# Patient Record
Sex: Female | Born: 1955 | Race: White | Hispanic: No | Marital: Married | Smoking: Never smoker
Health system: Southern US, Community
[De-identification: ages and names within clinical notes are randomized; demographics above are authoritative.]

## PROBLEM LIST (undated history)

## (undated) DIAGNOSIS — E079 Disorder of thyroid, unspecified: Secondary | ICD-10-CM

## (undated) DIAGNOSIS — C4491 Basal cell carcinoma of skin, unspecified: Secondary | ICD-10-CM

## (undated) DIAGNOSIS — D860 Sarcoidosis of lung: Secondary | ICD-10-CM

## (undated) DIAGNOSIS — E785 Hyperlipidemia, unspecified: Secondary | ICD-10-CM

## (undated) DIAGNOSIS — E119 Type 2 diabetes mellitus without complications: Secondary | ICD-10-CM

## (undated) DIAGNOSIS — G4730 Sleep apnea, unspecified: Secondary | ICD-10-CM

## (undated) DIAGNOSIS — D849 Immunodeficiency, unspecified: Secondary | ICD-10-CM

## (undated) HISTORY — PX: CHOLECYSTECTOMY: SHX55

## (undated) HISTORY — PX: OTHER SURGICAL HISTORY: SHX169

## (undated) HISTORY — PX: ABLATION: SHX5711

## (undated) HISTORY — PX: LUNG TRANSPLANT: SHX234

---

## 2021-05-14 ENCOUNTER — Inpatient Hospital Stay (HOSPITAL_COMMUNITY): Payer: BC Managed Care – PPO

## 2021-05-14 ENCOUNTER — Emergency Department (HOSPITAL_COMMUNITY): Payer: BC Managed Care – PPO

## 2021-05-14 ENCOUNTER — Inpatient Hospital Stay (HOSPITAL_COMMUNITY)
Admission: EM | Admit: 2021-05-14 | Discharge: 2021-05-15 | DRG: 309 | Disposition: A | Discharge status: Home / Self Care | Payer: BC Managed Care – PPO | Attending: Internal Medicine | Admitting: Internal Medicine

## 2021-05-14 ENCOUNTER — Other Ambulatory Visit: Payer: Self-pay

## 2021-05-14 ENCOUNTER — Encounter (HOSPITAL_COMMUNITY): Payer: Self-pay | Admitting: *Deleted

## 2021-05-14 DIAGNOSIS — Z7984 Long term (current) use of oral hypoglycemic drugs: Secondary | ICD-10-CM | POA: Diagnosis not present

## 2021-05-14 DIAGNOSIS — I9589 Other hypotension: Secondary | ICD-10-CM | POA: Diagnosis present

## 2021-05-14 DIAGNOSIS — Z8249 Family history of ischemic heart disease and other diseases of the circulatory system: Secondary | ICD-10-CM | POA: Diagnosis not present

## 2021-05-14 DIAGNOSIS — B349 Viral infection, unspecified: Secondary | ICD-10-CM | POA: Diagnosis present

## 2021-05-14 DIAGNOSIS — I4891 Unspecified atrial fibrillation: Secondary | ICD-10-CM

## 2021-05-14 DIAGNOSIS — E039 Hypothyroidism, unspecified: Secondary | ICD-10-CM | POA: Diagnosis present

## 2021-05-14 DIAGNOSIS — E119 Type 2 diabetes mellitus without complications: Secondary | ICD-10-CM | POA: Diagnosis present

## 2021-05-14 DIAGNOSIS — Z7952 Long term (current) use of systemic steroids: Secondary | ICD-10-CM

## 2021-05-14 DIAGNOSIS — Z20822 Contact with and (suspected) exposure to covid-19: Secondary | ICD-10-CM | POA: Diagnosis present

## 2021-05-14 DIAGNOSIS — Z79890 Hormone replacement therapy (postmenopausal): Secondary | ICD-10-CM | POA: Diagnosis not present

## 2021-05-14 DIAGNOSIS — Z85828 Personal history of other malignant neoplasm of skin: Secondary | ICD-10-CM

## 2021-05-14 DIAGNOSIS — Z8673 Personal history of transient ischemic attack (TIA), and cerebral infarction without residual deficits: Secondary | ICD-10-CM

## 2021-05-14 DIAGNOSIS — I471 Supraventricular tachycardia: Principal | ICD-10-CM | POA: Diagnosis present

## 2021-05-14 DIAGNOSIS — Z7951 Long term (current) use of inhaled steroids: Secondary | ICD-10-CM

## 2021-05-14 DIAGNOSIS — R000 Tachycardia, unspecified: Secondary | ICD-10-CM | POA: Diagnosis not present

## 2021-05-14 DIAGNOSIS — E872 Acidosis, unspecified: Secondary | ICD-10-CM | POA: Diagnosis present

## 2021-05-14 DIAGNOSIS — E785 Hyperlipidemia, unspecified: Secondary | ICD-10-CM | POA: Diagnosis present

## 2021-05-14 DIAGNOSIS — D709 Neutropenia, unspecified: Secondary | ICD-10-CM | POA: Diagnosis present

## 2021-05-14 DIAGNOSIS — Z95828 Presence of other vascular implants and grafts: Secondary | ICD-10-CM

## 2021-05-14 DIAGNOSIS — Z86718 Personal history of other venous thrombosis and embolism: Secondary | ICD-10-CM | POA: Diagnosis not present

## 2021-05-14 DIAGNOSIS — R6510 Systemic inflammatory response syndrome (SIRS) of non-infectious origin without acute organ dysfunction: Secondary | ICD-10-CM | POA: Diagnosis present

## 2021-05-14 DIAGNOSIS — D860 Sarcoidosis of lung: Secondary | ICD-10-CM | POA: Diagnosis present

## 2021-05-14 DIAGNOSIS — Z942 Lung transplant status: Secondary | ICD-10-CM

## 2021-05-14 HISTORY — DX: Type 2 diabetes mellitus without complications: E11.9

## 2021-05-14 HISTORY — DX: Hyperlipidemia, unspecified: E78.5

## 2021-05-14 HISTORY — DX: Immunodeficiency, unspecified: D84.9

## 2021-05-14 HISTORY — DX: Disorder of thyroid, unspecified: E07.9

## 2021-05-14 HISTORY — DX: Sarcoidosis of lung: D86.0

## 2021-05-14 HISTORY — DX: Sleep apnea, unspecified: G47.30

## 2021-05-14 HISTORY — DX: Basal cell carcinoma of skin, unspecified: C44.91

## 2021-05-14 LAB — ECHOCARDIOGRAM COMPLETE
AR max vel: 1.94 cm2
AV Area VTI: 2.27 cm2
AV Area mean vel: 2.05 cm2
AV Mean grad: 3 mmHg
AV Peak grad: 5.4 mmHg
Ao pk vel: 1.16 m/s
Area-P 1/2: 3.77 cm2
Calc EF: 66.3 %
Height: 64 in
MV VTI: 0.41 cm2
S' Lateral: 1.6 cm
Single Plane A2C EF: 69 %
Single Plane A4C EF: 61 %
Weight: 2560 oz

## 2021-05-14 LAB — RESPIRATORY PANEL BY PCR

## 2021-05-14 LAB — COMPREHENSIVE METABOLIC PANEL
ALT: 19 U/L (ref 0–44)
AST: 29 U/L (ref 15–41)
Albumin: 3.9 g/dL (ref 3.5–5.0)
Alkaline Phosphatase: 58 U/L (ref 38–126)
Anion gap: 9 (ref 5–15)
BUN: 23 mg/dL (ref 8–23)
CO2: 24 mmol/L (ref 22–32)
Calcium: 9 mg/dL (ref 8.9–10.3)
Chloride: 103 mmol/L (ref 98–111)
Creatinine, Ser: 1.28 mg/dL — ABNORMAL HIGH (ref 0.44–1.00)
GFR, Estimated: 47 mL/min — ABNORMAL LOW (ref >60)
Glucose, Bld: 207 mg/dL — ABNORMAL HIGH (ref 70–99)
Potassium: 3.7 mmol/L (ref 3.5–5.1)
Sodium: 136 mmol/L (ref 135–145)
Total Bilirubin: 0.7 mg/dL (ref 0.3–1.2)
Total Protein: 6.7 g/dL (ref 6.5–8.1)

## 2021-05-14 LAB — MRSA NEXT GEN BY PCR, NASAL: MRSA by PCR Next Gen: NOT DETECTED

## 2021-05-14 LAB — DIFFERENTIAL
Abs Immature Granulocytes: 0.37 10*3/uL — ABNORMAL HIGH (ref 0.00–0.07)
Basophils Absolute: 0 10*3/uL (ref 0.0–0.1)
Basophils Relative: 0 %
Eosinophils Absolute: 0.1 10*3/uL (ref 0.0–0.5)
Eosinophils Relative: 1 %
Immature Granulocytes: 10 %
Lymphocytes Relative: 29 %
Lymphs Abs: 1.1 10*3/uL (ref 0.7–4.0)
Monocytes Absolute: 0.5 10*3/uL (ref 0.1–1.0)
Monocytes Relative: 14 %
Neutro Abs: 1.6 10*3/uL — ABNORMAL LOW (ref 1.7–7.7)
Neutrophils Relative %: 46 %

## 2021-05-14 LAB — CBC
HCT: 34.4 % — ABNORMAL LOW (ref 36.0–46.0)
Hemoglobin: 11.2 g/dL — ABNORMAL LOW (ref 12.0–15.0)
MCH: 33.4 pg (ref 26.0–34.0)
MCHC: 32.6 g/dL (ref 30.0–36.0)
MCV: 102.7 fL — ABNORMAL HIGH (ref 80.0–100.0)
Platelets: 151 10*3/uL (ref 150–400)
RBC: 3.35 MIL/uL — ABNORMAL LOW (ref 3.87–5.11)
RDW: 15 % (ref 11.5–15.5)
WBC: 3.5 10*3/uL — ABNORMAL LOW (ref 4.0–10.5)
nRBC: 0 % (ref 0.0–0.2)

## 2021-05-14 LAB — TSH: TSH: 2.813 u[IU]/mL (ref 0.350–4.500)

## 2021-05-14 LAB — URINALYSIS, ROUTINE W REFLEX MICROSCOPIC
Bilirubin Urine: NEGATIVE
Glucose, UA: NEGATIVE mg/dL
Hgb urine dipstick: NEGATIVE
Ketones, ur: NEGATIVE mg/dL
Leukocytes,Ua: NEGATIVE
Nitrite: NEGATIVE
Protein, ur: NEGATIVE mg/dL
Specific Gravity, Urine: 1.011 (ref 1.005–1.030)
pH: 7 (ref 5.0–8.0)

## 2021-05-14 LAB — I-STAT CHEM 8, ED
BUN: 20 mg/dL (ref 8–23)
Calcium, Ion: 1.12 mmol/L — ABNORMAL LOW (ref 1.15–1.40)
Chloride: 104 mmol/L (ref 98–111)
Creatinine, Ser: 1.3 mg/dL — ABNORMAL HIGH (ref 0.44–1.00)
Glucose, Bld: 201 mg/dL — ABNORMAL HIGH (ref 70–99)
HCT: 32 % — ABNORMAL LOW (ref 36.0–46.0)
Hemoglobin: 10.9 g/dL — ABNORMAL LOW (ref 12.0–15.0)
Potassium: 3.9 mmol/L (ref 3.5–5.1)
Sodium: 140 mmol/L (ref 135–145)
TCO2: 26 mmol/L (ref 22–32)

## 2021-05-14 LAB — RESP PANEL BY RT-PCR (FLU A&B, COVID) ARPGX2
Influenza A by PCR: NEGATIVE
Influenza B by PCR: NEGATIVE
SARS Coronavirus 2 by RT PCR: NEGATIVE

## 2021-05-14 LAB — HIV ANTIBODY (ROUTINE TESTING W REFLEX): HIV Screen 4th Generation wRfx: NONREACTIVE

## 2021-05-14 LAB — APTT: aPTT: 27 seconds (ref 24–36)

## 2021-05-14 LAB — PROTIME-INR
INR: 1 (ref 0.8–1.2)
Prothrombin Time: 12.8 seconds (ref 11.4–15.2)

## 2021-05-14 LAB — LACTIC ACID, PLASMA
Lactic Acid, Venous: 2.2 mmol/L (ref 0.5–1.9)
Lactic Acid, Venous: 2.5 mmol/L (ref 0.5–1.9)

## 2021-05-14 LAB — MAGNESIUM: Magnesium: 1.9 mg/dL (ref 1.7–2.4)

## 2021-05-14 LAB — BRAIN NATRIURETIC PEPTIDE: B Natriuretic Peptide: 261.7 pg/mL — ABNORMAL HIGH (ref 0.0–100.0)

## 2021-05-14 MED ORDER — LACTATED RINGERS IV BOLUS
500.0000 mL | Freq: Once | INTRAVENOUS | Status: AC
  Administered 2021-05-14: 500 mL via INTRAVENOUS

## 2021-05-14 MED ORDER — VANCOMYCIN HCL IN DEXTROSE 1-5 GM/200ML-% IV SOLN
1000.0000 mg | INTRAVENOUS | Status: DC
  Administered 2021-05-15: 1000 mg via INTRAVENOUS
  Filled 2021-05-14: qty 200

## 2021-05-14 MED ORDER — LACTATED RINGERS IV SOLN: INTRAVENOUS | Status: AC

## 2021-05-14 MED ORDER — VANCOMYCIN HCL IN DEXTROSE 1-5 GM/200ML-% IV SOLN: 1000.0000 mg | Freq: Once | INTRAVENOUS | Status: DC

## 2021-05-14 MED ORDER — LETERMOVIR 240 MG PO TABS: 480.0000 mg | ORAL_TABLET | Freq: Every day | ORAL | Status: DC

## 2021-05-14 MED ORDER — METRONIDAZOLE 500 MG/100ML IV SOLN
500.0000 mg | Freq: Once | INTRAVENOUS | Status: AC
  Administered 2021-05-14: 500 mg via INTRAVENOUS
  Filled 2021-05-14: qty 100

## 2021-05-14 MED ORDER — SODIUM CHLORIDE 0.9 % IV SOLN
2.0000 g | Freq: Two times a day (BID) | INTRAVENOUS | Status: DC
  Administered 2021-05-14 – 2021-05-15 (×2): 2 g via INTRAVENOUS
  Filled 2021-05-14 (×2): qty 2

## 2021-05-14 MED ORDER — ALBUTEROL SULFATE (2.5 MG/3ML) 0.083% IN NEBU
2.5000 mg | INHALATION_SOLUTION | Freq: Four times a day (QID) | RESPIRATORY_TRACT | Status: DC | PRN
Start: 2021-05-14 — End: 2021-05-15

## 2021-05-14 MED ORDER — PANTOPRAZOLE SODIUM 40 MG PO TBEC
40.0000 mg | DELAYED_RELEASE_TABLET | Freq: Every day | ORAL | Status: DC
  Administered 2021-05-14 – 2021-05-15 (×2): 40 mg via ORAL
  Filled 2021-05-14 (×2): qty 1

## 2021-05-14 MED ORDER — FLUTICASONE FUROATE-VILANTEROL 200-25 MCG/ACT IN AEPB
1.0000 | INHALATION_SPRAY | Freq: Every day | RESPIRATORY_TRACT | Status: DC
  Filled 2021-05-14: qty 28

## 2021-05-14 MED ORDER — LEVOTHYROXINE SODIUM 50 MCG PO TABS
50.0000 ug | ORAL_TABLET | Freq: Every day | ORAL | Status: DC
  Administered 2021-05-15: 50 ug via ORAL
  Filled 2021-05-14: qty 1

## 2021-05-14 MED ORDER — ATOVAQUONE 750 MG/5ML PO SUSP
1500.0000 mg | Freq: Every morning | ORAL | Status: DC
  Administered 2021-05-14 – 2021-05-15 (×2): 1500 mg via ORAL
  Filled 2021-05-14 (×2): qty 10

## 2021-05-14 MED ORDER — DILTIAZEM LOAD VIA INFUSION
10.0000 mg | Freq: Once | INTRAVENOUS | Status: AC
  Administered 2021-05-14: 10 mg via INTRAVENOUS
  Filled 2021-05-14: qty 10

## 2021-05-14 MED ORDER — SODIUM CHLORIDE 0.9 % IV SOLN
2.0000 g | Freq: Once | INTRAVENOUS | Status: AC
  Administered 2021-05-14: 2 g via INTRAVENOUS
  Filled 2021-05-14: qty 2

## 2021-05-14 MED ORDER — LACTATED RINGERS IV SOLN: INTRAVENOUS | Status: DC

## 2021-05-14 MED ORDER — ONDANSETRON HCL 4 MG/2ML IJ SOLN
4.0000 mg | Freq: Four times a day (QID) | INTRAMUSCULAR | Status: DC | PRN
Start: 2021-05-14 — End: 2021-05-15

## 2021-05-14 MED ORDER — MYCOPHENOLATE MOFETIL 250 MG PO CAPS: 250.0000 mg | ORAL_CAPSULE | Freq: Two times a day (BID) | ORAL | Status: DC

## 2021-05-14 MED ORDER — MAGNESIUM OXIDE -MG SUPPLEMENT 400 (240 MG) MG PO TABS: 200.0000 mg | ORAL_TABLET | Freq: Every day | ORAL | Status: DC

## 2021-05-14 MED ORDER — HEPARIN BOLUS VIA INFUSION
2000.0000 [IU] | Freq: Once | INTRAVENOUS | Status: AC
  Administered 2021-05-14: 2000 [IU] via INTRAVENOUS
  Filled 2021-05-14: qty 2000

## 2021-05-14 MED ORDER — LACTATED RINGERS IV BOLUS
500.0000 mL | Freq: Once | INTRAVENOUS | Status: DC
Start: 2021-05-14 — End: 2021-05-14

## 2021-05-14 MED ORDER — MONTELUKAST SODIUM 10 MG PO TABS
10.0000 mg | ORAL_TABLET | Freq: Every day | ORAL | Status: DC
  Administered 2021-05-14: 10 mg via ORAL
  Filled 2021-05-14: qty 1

## 2021-05-14 MED ORDER — PREDNISONE 5 MG PO TABS
5.0000 mg | ORAL_TABLET | Freq: Every day | ORAL | Status: DC
  Administered 2021-05-14 – 2021-05-15 (×2): 5 mg via ORAL
  Filled 2021-05-14 (×2): qty 1

## 2021-05-14 MED ORDER — LACTATED RINGERS IV BOLUS (SEPSIS)
1000.0000 mL | Freq: Once | INTRAVENOUS | Status: AC
  Administered 2021-05-14: 1000 mL via INTRAVENOUS

## 2021-05-14 MED ORDER — MAGNESIUM OXIDE -MG SUPPLEMENT 400 (240 MG) MG PO TABS
400.0000 mg | ORAL_TABLET | Freq: Every day | ORAL | Status: DC
  Administered 2021-05-14: 400 mg via ORAL
  Filled 2021-05-14 (×2): qty 1

## 2021-05-14 MED ORDER — VALACYCLOVIR HCL 500 MG PO TABS
500.0000 mg | ORAL_TABLET | Freq: Two times a day (BID) | ORAL | Status: DC
Start: 2021-05-14 — End: 2021-05-15
  Administered 2021-05-14 – 2021-05-15 (×3): 500 mg via ORAL
  Filled 2021-05-14 (×3): qty 1

## 2021-05-14 MED ORDER — ROSUVASTATIN CALCIUM 10 MG PO TABS
10.0000 mg | ORAL_TABLET | Freq: Every day | ORAL | Status: DC
  Administered 2021-05-14: 10 mg via ORAL
  Filled 2021-05-14: qty 1

## 2021-05-14 MED ORDER — VANCOMYCIN HCL 1250 MG/250ML IV SOLN
1250.0000 mg | Freq: Once | INTRAVENOUS | Status: AC
  Administered 2021-05-14: 1250 mg via INTRAVENOUS
  Filled 2021-05-14: qty 250

## 2021-05-14 MED ORDER — HEPARIN (PORCINE) 25000 UT/250ML-% IV SOLN
1100.0000 [IU]/h | INTRAVENOUS | Status: DC
  Administered 2021-05-14: 1100 [IU]/h via INTRAVENOUS
  Filled 2021-05-14: qty 250

## 2021-05-14 MED ORDER — DILTIAZEM HCL-DEXTROSE 125-5 MG/125ML-% IV SOLN (PREMIX)
5.0000 mg/h | INTRAVENOUS | Status: DC
  Administered 2021-05-14: 5 mg/h via INTRAVENOUS
  Filled 2021-05-14: qty 125

## 2021-05-14 MED ORDER — ACETAMINOPHEN 325 MG PO TABS: 650.0000 mg | ORAL_TABLET | ORAL | Status: DC | PRN

## 2021-05-14 MED ORDER — TACROLIMUS 0.5 MG PO CAPS
0.5000 mg | ORAL_CAPSULE | Freq: Two times a day (BID) | ORAL | Status: DC
Start: 2021-05-14 — End: 2021-05-15
  Administered 2021-05-14 – 2021-05-15 (×3): 0.5 mg via ORAL
  Filled 2021-05-14 (×3): qty 1

## 2021-05-14 MED ORDER — FLUTICASONE PROPIONATE 50 MCG/ACT NA SUSP
2.0000 | Freq: Every day | NASAL | Status: DC
  Administered 2021-05-15: 2 via NASAL
  Filled 2021-05-14: qty 16

## 2021-05-14 MED ORDER — AZELASTINE HCL 0.1 % NA SOLN
2.0000 | Freq: Two times a day (BID) | NASAL | Status: DC
  Administered 2021-05-14 – 2021-05-15 (×2): 2 via NASAL
  Filled 2021-05-14: qty 30

## 2021-05-14 MED ORDER — ALBUTEROL SULFATE HFA 108 (90 BASE) MCG/ACT IN AERS: 1.0000 | INHALATION_SPRAY | Freq: Four times a day (QID) | RESPIRATORY_TRACT | Status: DC | PRN

## 2021-05-14 MED ORDER — DILTIAZEM HCL 30 MG PO TABS
30.0000 mg | ORAL_TABLET | Freq: Once | ORAL | Status: AC
  Administered 2021-05-14: 30 mg via ORAL
  Filled 2021-05-14: qty 1

## 2021-05-14 NOTE — Progress Notes (Signed)
Elink following Code Sepsis bundle. ?

## 2021-05-14 NOTE — ED Notes (Signed)
Lactic acid 2.2, Nanavati, MD notified.

## 2021-05-14 NOTE — Progress Notes (Addendum)
ANTICOAGULATION CONSULT NOTE - Initial Consult  Pharmacy Consult for IV heparin Indication: atrial fibrillation  Allergies  Allergen Reactions   Bactrim [Sulfamethoxazole-Trimethoprim]     Patient Measurements: Height: 5\' 4"  (162.6 cm) Weight: 72.6 kg (160 lb) IBW/kg (Calculated) : 54.7 Heparin Dosing Weight:   Vital Signs: Temp: 98 F (36.7 C) (02/17 0835) Temp Source: Oral (02/17 0835) BP: 106/72 (02/17 1015) Pulse Rate: 133 (02/17 1027)  Labs: Recent Labs    05/14/21 0848  HGB 11.2*  HCT 34.4*  PLT 151  APTT 27  LABPROT 12.8  INR 1.0  CREATININE 1.28*    Estimated Creatinine Clearance: 43.4 mL/min (A) (by C-G formula based on SCr of 1.28 mg/dL (H)).   Medical History: Past Medical History:  Diagnosis Date   Basal cell carcinoma    Coronary artery disease    Diabetes mellitus without complication (HCC)    Hyperlipemia    Immunosuppression (HCC)    Pulmonary sarcoidosis (HCC)    Sleep apnea    Thyroid disease     Medications:  Scheduled:  Infusions:   diltiazem (CARDIZEM) infusion 5 mg/hr (05/14/21 1008)   lactated ringers 150 mL/hr at 05/14/21 1013   metronidazole 500 mg (05/14/21 1011)   vancomycin      Assessment: 66 yo presented with Afib and CP with hx lung tx at [facility] in 2021 on mycophenolate/tacrolimus. To start IV heparin per Md order for now. Baseline aPTT 27. Note that patient previously on Eliquis and had IVC filter placed for hx DVT from May 2021 until it was stopped June 2022 after resolution of clots per scans and IVC was subsequently removed Aug 2022 as well.   Goal of Therapy:  Heparin level 0.3-0.7 units/ml Heparin level 66-102 units/ml Monitor platelets by anticoagulation protocol: Yes   Plan:  NO IV Heparin bolus Start IV heparin at rate of 1100 units/hr Check heparin level/aPTT 6 hours after start of IV heparin Daily CBC  Kara Mead 05/14/2021,10:28 AM

## 2021-05-14 NOTE — ED Notes (Signed)
Patient ambulated to restroom and back without assistance, steady gait noted. Urine samples provided.

## 2021-05-14 NOTE — Consult Note (Addendum)
Cardiology Consultation:   Patient ID: Brittany Coleman MRN: [id number]; DOB: 12-24-1955  Admit date: 05/14/2021 Date of Consult: 05/14/2021  PCP:  [facility] Cardiologist:  None   {   Patient Profile:   Brittany Coleman is a 66 y.o. 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 Kathrynn Humble.  History of Present Illness:   Brittany Coleman is a 66 year old female with the above history.  Brittany lives in a mobile home but is here in town for a wrestling tournament.  She has a history of frequent PVCs and actually underwent PVC ablation around 2018/2019 at [facility] in [place], [place].  She was diagnosed pulmonary sarcoidoisis in 2016 and underwent bilateral lung transplant in 06/2019 at [facility].  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  She had an IVC filter placed and now is no longer on anticoagulation. Cardiac catheterization prior to transplant showed normal coronary arteries.  MRI prior to transplant showed LVEF of 67% with mild basal sigmoid septal hypertrophy but no regional wall motion abnormalities and no evidence of MI, scar, or infiltrative disease.  She states she had an exercise stress test in 05/2020 due to worsening shortness of breath.  I am unable to see full results in Care Everywhere but patient states stress test was unremarkable and her dyspnea was felt to be due to deconditioning.  She receives all of her care at [facility].  Patient presented to the [facility], ED on 05/14/2021 for further evaluation of palpitations with associated dizziness and chest pain. She was  tachycardic on arrival with rates in the 150s on arrival.  Possible atrial fibrillation vs SVT (very regular). Patient was initially hypotensive on arrival with BP of 42/26; however BP improved after patient was placed in Trendelenburg. BNP mildly elevated at 261.  Chest x-ray shows no acute findings. WBC 3.5, Hgb 11.2, Plts 151. Na 136, K 3.7, Glucose 207, BUN 23, Cr 1.28, LFTs normal. Lactic acid 2.2 >> 2.5.  Patient was initially put on IV Cardizem but this had to be stopped due to recurrent hypotension.   At the time of this evaluation, patient resting comfortably in no acute distress.  She is converted back to sinus rhythm with rates in the 60s.  States she woke up around 6 AM this morning with elevated heart rate and palpitations.  She checked her heart rate on her pulse oximeter and rates were in the 150s.  She had associated dizziness and possibly some chest discomfort (difficult to differentiate between palpitations and other chest pain).  Of note, she does have a history of postop atrial fibrillation after transplant as noted above.  She also notes 3 separate episodes over the last year of tachypalpitations.  First occurred at the end of June.  She states she had tachycardia with rates in the 140s to 150s that lasted for about 15 to 20 minutes before resolving on its own.  She had another similar episode in October and again in December.  During each of these episodes, palpitations only lasted for 15 to 20 minutes before resolving.  However, this morning was different and palpitations persisted.  Patient called transplant coordinator at  [facility] who recommended going to the ED.  Patient reports a fever yesterday morning (Tmax 100.8) with some mild body aches.  She took a COVID test which was negative.  She then took a dose of Tylenol and symptoms completely resolved and have not returned.  She has chronic sinus issues with nasal congestion, postnasal drip, and productive cough but this is not new.  No other  recent illnesses.  She has chronic shortness of breath with exertion but no resting dyspnea. She has slept on an incline for year and this is stable. No PND. She has intermittent lower extremity edema for which she takes Lasix as needed for.  She does report a syncopal episode here in the ED when she became significantly hypotensive.  No other syncope or syncopal episodes. She has chronic intermittent diarrhea but no other GI symptoms. No abnormal bleeding in urine or stools.  Past Medical History:  Diagnosis Date   Basal cell carcinoma    Diabetes mellitus without complication ([place])    Hyperlipemia    Immunosuppression ([place])    Pulmonary sarcoidosis (HCC)    Sleep apnea    Thyroid disease     Past Surgical History:  Procedure Laterality Date   ABLATION     CHOLECYSTECTOMY     LUNG TRANSPLANT     schatzki       Home Medications:  Prior to Admission medications   Medication Sig Start Date End Date Taking? Authorizing Provider  acetaminophen (TYLENOL) 500 MG tablet Take 500 mg by mouth every 6 (six) hours as needed for mild pain, moderate pain, fever or headache.   Yes [provider]  albuterol (VENTOLIN HFA) 108 (90 Base) MCG/ACT inhaler Inhale 1-2 puffs into the lungs every 6 (six) hours as needed for shortness of breath or wheezing. 03/07/21  Yes [provider]  atovaquone (MEPRON) 750 MG/5ML suspension Take 1,500 mg by mouth every morning. 04/23/21  Yes [provider]  Azelastine HCl 137 MCG/SPRAY SOLN Place 2 sprays into both nostrils 2 (two) times daily. 04/18/21  Yes [provider]  BREO ELLIPTA 200-25 MCG/ACT AEPB Inhale 1 puff into the lungs daily. 05/13/21  Yes [provider]  esomeprazole (NEXIUM) 40 MG capsule Take 40 mg by mouth 2 (two) times daily. 03/13/21  Yes [provider]  fluticasone (FLONASE) 50 MCG/ACT nasal spray Place 2 sprays into both nostrils daily. 01/15/21  Yes [provider]  glipiZIDE  (GLUCOTROL) 5 MG tablet Take 10 mg by mouth every morning. 02/26/21  Yes [provider]  levothyroxine (SYNTHROID) 50 MCG tablet Take 50 mcg by mouth daily. 02/28/21  Yes [provider]  MELATONIN PO Take 1 tablet by mouth at bedtime as needed (sleep).   Yes [provider]  montelukast (SINGULAIR) 10 MG tablet Take 10 mg by mouth at bedtime. 03/08/21  Yes [provider]  Multiple Vitamins-Minerals (HAIR/SKIN/NAILS/BIOTIN PO) Take 1 capsule by mouth daily.   Yes [provider]  mycophenolate (CELLCEPT) 250 MG capsule Take 250 mg by mouth 2 (two) times daily. 02/17/21  Yes [provider]  ondansetron (ZOFRAN) 4 MG tablet Take 4 mg by mouth every 6 (six) hours as needed for nausea or vomiting. 03/07/21  Yes [provider]  OZEMPIC, 0.25 OR 0.5 MG/DOSE, 2 MG/1.5ML SOPN Inject 0.25 mg into the skin once a week. monday 05/05/21  Yes [provider]  predniSONE (DELTASONE) 5 MG tablet Take 5 mg by mouth daily. continuous 03/09/21  Yes [provider]  PREVYMIS 240 MG TABS Take 480 mg by mouth daily. 03/25/21  Yes [provider]  rosuvastatin (CRESTOR) 10 MG tablet Take 10 mg by mouth at bedtime. 02/28/21  Yes [provider]  tacrolimus (PROGRAF) 0.5 MG capsule Take 0.5 mg by mouth 2 (two) times daily. 02/24/21  Yes [provider]  triamcinolone cream (KENALOG) 0.1 % Apply 1 application topically See admin instructions. Use as directed 03/07/21  Yes [provider]  valACYclovir (VALTREX) 500 MG tablet Take 500 mg by mouth 2 (two) times daily. 04/16/21  Yes [provider]    Inpatient Medications: Scheduled Meds:  atovaquone  1,500 mg Oral q morning   azelastine  2 spray Each Nare BID   [START ON 05/15/2021] fluticasone  2 spray Each Nare Daily   [START ON 05/15/2021] fluticasone furoate-vilanterol  1 puff Inhalation Daily   Letermovir  480 mg Oral Daily   [START ON  05/15/2021] levothyroxine  50 mcg Oral Q0600   montelukast  10 mg Oral QHS   pantoprazole  40 mg Oral Daily   predniSONE  5 mg Oral Daily   rosuvastatin  10 mg Oral QHS   tacrolimus  0.5 mg Oral BID   valACYclovir  500 mg Oral BID   Continuous Infusions:  diltiazem (CARDIZEM) infusion Stopped (05/14/21 1135)   heparin 1,100 Units/hr (05/14/21 1043)   lactated ringers 125 mL/hr at 05/14/21 1047   PRN Meds:   Allergies:    Allergies  Allergen Reactions   Dapsone Anaphylaxis    Methemoglobinemia    Levofloxacin Swelling    Ankle edema   Trimethoprim     Other reaction(s): Unknown   Sulfamethoxazole Itching and Rash   Sulfamethoxazole-Trimethoprim Itching and Rash    Social History:   Social History   Socioeconomic History   Marital status: Married    Spouse name: Not on file   Number of children: Not on file   Years of education: Not on file   Highest education level: Not on file  Occupational History   Not on file  Tobacco Use   Smoking status: Never   Smokeless tobacco: Never  Vaping Use   Vaping Use: Never used  Substance and Sexual Activity   Alcohol use: Never   Drug use: Never   Sexual activity: Not on file  Other Topics Concern   Not on file  Social History Narrative   Not on file   Social Determinants of Health   Financial Resource Strain: Not on file  Food Insecurity: Not on file  Transportation Needs: Not on file  Physical Activity: Not on file  Stress: Not on file  Social Connections: Not on file  Intimate Partner Violence: Not on file    Family History:   Family History  Problem Relation Age of Onset   Heart disease Maternal Grandfather    Heart attack Maternal Grandfather        died in 73s from presumed heart attack   Heart disease Paternal Grandfather      ROS:  Please see the history of present illness.  Review of Systems  Constitutional:  Negative for fever.  HENT:  Positive for congestion.   Respiratory:  Positive for cough,  sputum production and shortness of breath. Negative for hemoptysis.   Cardiovascular:  Positive for chest pain, palpitations and leg swelling. Negative for PND.  Gastrointestinal:  Positive for diarrhea. Negative for blood in stool, melena, nausea and vomiting.  Genitourinary:  Negative for hematuria.  Musculoskeletal:  Positive for myalgias.  Neurological:  Positive for dizziness and loss of consciousness.  Endo/Heme/Allergies:  Does not bruise/bleed easily.  Psychiatric/Behavioral:  Negative for substance abuse.    Physical Exam/Data:   Vitals:   05/14/21 1230 05/14/21 1245 05/14/21 1300 05/14/21 1315  BP: (!) 83/65 (!) 88/65 92/64 108/68  Pulse: 70 79 65 72  Resp: (!) 25 16 13 16   Temp:      TempSrc:      SpO2: 100% 99% 100% 100%  Weight:      Height:        Intake/Output Summary (Last 24 hours) at 05/14/2021 1322 Last data filed at 05/14/2021 1319 Gross per 24 hour  Intake 2046.22 ml  Output --  Net 2046.22 ml   Last 3 Weights 05/14/2021  Weight (lbs) 160 lb  Weight (kg) 72.576 kg     Body mass index is 27.46 kg/m.  General: 66 y.o. Caucasian female resting comfortably in no acute distress. HEENT: Normocephalic and atraumatic. Sclera clear.  Neck: Supple. No JVD. Heart: RRR. Distinct S1 and S2. No murmurs, gallops, or rubs. Radial pulses 2+ and equal bilaterally. Lungs: No increased work of breathing. Clear to ausculation bilaterally. No wheezes, rhonchi, or rales.  Abdomen: Soft, non-distended, and non-tender to palpation.  Extremities: Trace lower extremity edema bilaterally. Skin: Warm and dry. Neuro: Alert and oriented x3. No focal deficits. Psych: Normal affect. Responds appropriately.   EKG:  The EKG was personally reviewed and demonstrates: Tachycardia that is very regularly (suspect SVT but paroxysmal atrial fibrillation possible). Rate 155 bpm. Known RBBB.  Telemetry:  Telemetry was personally reviewed and demonstrates:  Initially in a narrow complex  tachycardia that was very regularly (SVT vs atrial fibrillation) with rates in the 150s but converted to sinus rhythm with rates in the 60s around 10:30am.  Relevant CV Studies: See HPI.  Laboratory Data:  High Sensitivity Troponin:  No results for input(s): TROPONINIHS in the last 720 hours.   Chemistry Recent Labs  Lab 05/14/21 0848  NA 136  K 3.7  CL 103  CO2 24  GLUCOSE 207*  BUN 23  CREATININE 1.28*  CALCIUM 9.0  MG 1.9  GFRNONAA 47*  ANIONGAP 9    Recent Labs  Lab 05/14/21 0848  PROT 6.7  ALBUMIN 3.9  AST 29  ALT 19  ALKPHOS 58  BILITOT 0.7   Lipids No results for input(s): CHOL, TRIG, HDL, LABVLDL, LDLCALC, CHOLHDL in the last 168 hours.  Hematology Recent Labs  Lab 05/14/21 0848  WBC 3.5*  RBC 3.35*  HGB 11.2*  HCT 34.4*  MCV 102.7*  MCH 33.4  MCHC 32.6  RDW 15.0  PLT 151   Thyroid  Recent Labs  Lab 05/14/21 0904  TSH 2.813    BNP Recent Labs  Lab 05/14/21 0904  BNP 261.7*    DDimer No results for input(s): DDIMER in the last 168 hours.   Radiology/Studies:  [facility] Chest Port 1 View  Result Date: 05/14/2021 CLINICAL DATA:  Chest pain. EXAM: PORTABLE CHEST 1 VIEW COMPARISON:  None. FINDINGS: The heart size and mediastinal contours are within normal limits. Both lungs are clear. The visualized skeletal structures are unremarkable. Surgical wires are seen projected over the cardiomediastinal silhouette consistent with history of lung transplant. IMPRESSION: No active disease. Electronically Signed   By: Marijo Conception M.D.   On: 05/14/2021 09:09     Assessment and Plan:   Paroxysmal Atrial Fibrillation vs. SVT Patient has a history of postop  atrial fibrillation following lung transplant in 06/2019 at [facility].  She now presents with tachycardia with rates in the 150s.  Very regular on EKG and telemetry so wondering if this is more SVT/PAT rather than true atrial fibrillation.  Of note, patient does report 3 other similar episodes over the last  year that only lasted for 15 to 20 minutes at a time.  This episode was much longer and she had associated dizziness and chest pain with this.  She was acutely hypotensive on arrival and had a syncopal episode with this.  BP improved when she was placed in Trendelenburg and IV Cardizem was briefly attempted but had to be stopped due to soft BP.  Thankfully, she has converted back to sinus rhythm.  Rates currently in the 17s.  I do not think she will tolerate AV nodal agents due to baseline systolic BP in the low 58K. We will check an Echo.  Electrolytes and TSH normal.  Will review EKG and telemetry with Dr. Percival Spanish.  If SVT, I would suggest to follow-up with the EP for consideration of an ablation.  Patient is not on chronic anticoagulation anymore. In the event that this was atrial fibrillation, Dr. Percival Spanish does not recommended restarting DOAC at this time given short episode. Would recommend outpatient monitor.  All of patient's care is a [facility] so she will likely follow there.  Otherwise, per primary team: - Elevated lactic acid - Pulmonary sarcoidosis s/p lung transplant - Hyperlipidemia - Hypothyroidism  Risk Assessment/Risk Scores:    CHA2DS2-VASc Score = 5  This indicates a 7.2% annual risk of stroke. The patient's score is based upon: CHF History: 0 HTN History: 0 Diabetes History: 1 (from chronic steroid use) Stroke History: 2 (DVT following lung transplant) Vascular Disease History: 1 (aortic calcifications noted on prior CT) Age Score: 0 Gender Score: 1    For questions or updates, please contact [place] Please consult [email or web address] for contact info under    Signed, Darreld Mclean, PA-C  05/14/2021 1:22 PM  History and all data above reviewed.  Patient examined.  I agree with the findings as above.  The patient has a history of lung transplant.  She had a history of PVCs ablated.  She has had now 3 or 4 episodes of rapid heart rate.  This has been unlike her  previous PVCs.  She does not really recall atrial fibrillation postop.  She had a fever prior to coming to [facility] for a wrestling tournament.  However, she felt relatively well after this.  The fever was apparently Wednesday or Thursday morning.  She had not been having any cough.  She had not been having any chills.  She had no diaphoresis or other localizing symptoms.  However, this morning she woke up with rapid heart rate.  She was noted to be in a rate of about 150 with right bundle branch block.  She was hypotensive.  She does not describe chest pain but she felt weak.  In the emergency room she was treated with IV Cardizem and started on heparin.  She subsequently converted to sinus rhythm with an underlying right bundle branch block.  Review of the rhythm strips demonstrates consistent rate of 150 which might represent 2-1 flutter.   The patient exam reveals COR: Regular rate and rhythm, no murmurs,  Lungs: Clear to auscultation bilaterally,  Abd: Positive bowel sounds in frequency and pitch, Ext no edema.  All available labs, radiology testing, previous records reviewed. Agree with documented  assessment and plan.  Supraventricular tachycardia: The mechanism is not entirely clear though I suspect atrial flutter.  She is being heparinized but at this point I would not suggest that she needs long-term anticoagulation until we have had further evidence of flutter.  I do not see atrial fibrillation.  Regardless, since she lives not in [place] and gets a lot of her care at [facility] I have suggested that she follow-up with an electrophysiologist at [facility].  She has had recurrent episodes and should be considered for ablation.   She is very medically knowledgeable.  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   We will check an echocardiogram.  Check a TSH.  Jeneen Rinks Kimla Furth  1:37 PM  05/14/2021

## 2021-05-14 NOTE — H&P (Signed)
History and Physical    Patient: Brittany Coleman TDV:[id number] DOB: 04-07-1955 DOA: 05/14/2021 DOS: the patient was seen and examined on 05/14/2021 [facility]: [facility]  Patient coming from: Home  Chief Complaint:  Chief Complaint  Patient presents with   Atrial Fibrillation   Chest Pain    HPI:  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  She lives in [place] and is in town in [place] visiting her daughter.  She also has a history of postop A-fib and self-reported episodes of rapid heart rate at least 3 times over the past year when at home but did not seek medical attention.  It is unknown if this was PVCs or recurrent A-fib.  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  She had an IVC filter in the past which has also since been removed. Cardiology was consulted in the ER as she was suspected to be in A-fib with RVR. EKGs however showed rapid rate but rhythm did not appear to be consistent with a fib. Other consideration was that her rhythm was an SVT or possibly flutter.  She was started on a cardizem and heparin drip on admission.   Review of Systems: Review of Systems  Constitutional:  Positive for chills and fever. Negative for diaphoresis and malaise/fatigue.  HENT: Negative.    Eyes: Negative.   Respiratory:  Negative for cough, hemoptysis, sputum production, shortness of breath and wheezing.   Cardiovascular:  Positive for palpitations. Negative for chest pain and leg swelling.  Gastrointestinal: Negative.   Genitourinary: Negative.   Musculoskeletal: Negative.   Skin: Negative.   Neurological: Negative.   Endo/Heme/Allergies: Negative.   Psychiatric/Behavioral: Negative.     Past Medical History:   Diagnosis Date   Basal cell carcinoma    Diabetes mellitus without complication ([place])    Hyperlipemia    Immunosuppression ([place])    Pulmonary sarcoidosis ([place])    Sleep apnea    Thyroid disease    Past Surgical History:  Procedure Laterality Date   ABLATION     CHOLECYSTECTOMY     LUNG TRANSPLANT     [place]     Social History:  reports that she has never smoked. She has never used smokeless tobacco. She reports that she does not drink alcohol and does not use drugs.  Allergies  Allergen Reactions   Dapsone Anaphylaxis    Methemoglobinemia    Levofloxacin Swelling    Ankle edema   Trimethoprim     Other reaction(s): Unknown   Sulfamethoxazole Itching and Rash   Sulfamethoxazole-Trimethoprim Itching and Rash    Family History  Problem Relation Age of Onset   Heart disease Maternal Grandfather    Heart attack Maternal Grandfather        died in 23s from presumed heart attack   Heart disease Paternal Grandfather     Prior to Admission medications   Medication Sig Start Date End Date Taking? Authorizing Provider  acetaminophen (TYLENOL) 500 MG tablet Take 500 mg by mouth every 6 (six) hours as needed for mild pain, moderate pain, fever or headache.   Yes [provider]  albuterol (VENTOLIN HFA) 108 (90 Base) MCG/ACT inhaler Inhale 1-2 puffs into the lungs every 6 (six) hours as  needed for shortness of breath or wheezing. 03/07/21  Yes [provider]  atovaquone (MEPRON) 750 MG/5ML suspension Take 1,500 mg by mouth every morning. 04/23/21  Yes [provider]  Azelastine HCl 137 MCG/SPRAY SOLN Place 2 sprays into both nostrils 2 (two) times daily. 04/18/21  Yes [provider]  BREO ELLIPTA 200-25 MCG/ACT AEPB Inhale 1 puff into the lungs daily. 05/13/21  Yes [provider]  esomeprazole (NEXIUM) 40 MG capsule Take 40 mg by mouth 2 (two) times daily. 03/13/21  Yes [provider]  fluticasone (FLONASE) 50 MCG/ACT nasal  spray Place 2 sprays into both nostrils daily. 01/15/21  Yes [provider]  glipiZIDE (GLUCOTROL) 5 MG tablet Take 10 mg by mouth every morning. 02/26/21  Yes [provider]  levothyroxine (SYNTHROID) 50 MCG tablet Take 50 mcg by mouth daily. 02/28/21  Yes [provider]  MELATONIN PO Take 1 tablet by mouth at bedtime as needed (sleep).   Yes [provider]  montelukast (SINGULAIR) 10 MG tablet Take 10 mg by mouth at bedtime. 03/08/21  Yes [provider]  Multiple Vitamins-Minerals (HAIR/SKIN/NAILS/BIOTIN PO) Take 1 capsule by mouth daily.   Yes [provider]  mycophenolate (CELLCEPT) 250 MG capsule Take 250 mg by mouth 2 (two) times daily. 02/17/21  Yes [provider]  ondansetron (ZOFRAN) 4 MG tablet Take 4 mg by mouth every 6 (six) hours as needed for nausea or vomiting. 03/07/21  Yes [provider]  OZEMPIC, 0.25 OR 0.5 MG/DOSE, 2 MG/1.5ML SOPN Inject 0.25 mg into the skin once a week. monday 05/05/21  Yes [provider]  predniSONE (DELTASONE) 5 MG tablet Take 5 mg by mouth daily. continuous 03/09/21  Yes [provider]  PREVYMIS 240 MG TABS Take 480 mg by mouth daily. 03/25/21  Yes [provider]  rosuvastatin (CRESTOR) 10 MG tablet Take 10 mg by mouth at bedtime. 02/28/21  Yes [provider]  tacrolimus (PROGRAF) 0.5 MG capsule Take 0.5 mg by mouth 2 (two) times daily. 02/24/21  Yes [provider]  triamcinolone cream (KENALOG) 0.1 % Apply 1 application topically See admin instructions. Use as directed 03/07/21  Yes [provider]  valACYclovir (VALTREX) 500 MG tablet Take 500 mg by mouth 2 (two) times daily. 04/16/21  Yes [provider]    Physical Exam: Vitals:   05/14/21 1300 05/14/21 1315 05/14/21 1330 05/14/21 1345  BP: 92/64 108/68 110/86 108/72  Pulse: 65 72 67 65  Resp: 13 16 20 11   Temp:      TempSrc:      SpO2: 100% 100% 100% 100%   Weight:      Height:       Physical Exam Constitutional:      General: She is not in acute distress.    Appearance: She is well-developed. She is not ill-appearing.  HENT:     Head: Normocephalic and atraumatic.     Mouth/Throat:     Mouth: Mucous membranes are moist.  Eyes:     Extraocular Movements: Extraocular movements intact.  Cardiovascular:     Rate and Rhythm: Regular rhythm. Tachycardia present.  Pulmonary:     Effort: Pulmonary effort is normal. No respiratory distress.     Breath sounds: Normal breath sounds. No wheezing.  Abdominal:     General: Bowel sounds are normal. There is no distension.     Palpations: Abdomen is soft.     Tenderness: There is no abdominal tenderness.  Musculoskeletal:  General: No swelling. Normal range of motion.     Cervical back: Normal range of motion and neck supple.  Skin:    General: Skin is warm and dry.  Neurological:     General: No focal deficit present.     Mental Status: She is alert and oriented to person, place, and time.  Psychiatric:        Mood and Affect: Mood normal.        Behavior: Behavior normal.    Data Reviewed:  Results for orders placed or performed during the hospital encounter of 05/14/21 (from the past 24 hour(s))  Magnesium     Status: None   Collection Time: 05/14/21  8:48 AM  Result Value Ref Range   Magnesium 1.9 1.7 - 2.4 mg/dL  CBC     Status: Abnormal   Collection Time: 05/14/21  8:48 AM  Result Value Ref Range   WBC 3.5 (L) 4.0 - 10.5 K/uL   RBC 3.35 (L) 3.87 - 5.11 MIL/uL   Hemoglobin 11.2 (L) 12.0 - 15.0 g/dL   HCT 34.4 (L) 36.0 - 46.0 %   MCV 102.7 (H) 80.0 - 100.0 fL   MCH 33.4 26.0 - 34.0 pg   MCHC 32.6 30.0 - 36.0 g/dL   RDW 15.0 11.5 - 15.5 %   Platelets 151 150 - 400 K/uL   nRBC 0.0 0.0 - 0.2 %  Lactic acid, plasma     Status: Abnormal   Collection Time: 05/14/21  8:48 AM  Result Value Ref Range   Lactic Acid, Venous 2.2 (HH) 0.5 - 1.9 mmol/L  Comprehensive metabolic  panel     Status: Abnormal   Collection Time: 05/14/21  8:48 AM  Result Value Ref Range   Sodium 136 135 - 145 mmol/L   Potassium 3.7 3.5 - 5.1 mmol/L   Chloride 103 98 - 111 mmol/L   CO2 24 22 - 32 mmol/L   Glucose, Bld 207 (H) 70 - 99 mg/dL   BUN 23 8 - 23 mg/dL   Creatinine, Ser 1.28 (H) 0.44 - 1.00 mg/dL   Calcium 9.0 8.9 - 10.3 mg/dL   Total Protein 6.7 6.5 - 8.1 g/dL   Albumin 3.9 3.5 - 5.0 g/dL   AST 29 15 - 41 U/L   ALT 19 0 - 44 U/L   Alkaline Phosphatase 58 38 - 126 U/L   Total Bilirubin 0.7 0.3 - 1.2 mg/dL   GFR, Estimated 47 (L) >60 mL/min   Anion gap 9 5 - 15  Protime-INR     Status: None   Collection Time: 05/14/21  8:48 AM  Result Value Ref Range   Prothrombin Time 12.8 11.4 - 15.2 seconds   INR 1.0 0.8 - 1.2  APTT     Status: None   Collection Time: 05/14/21  8:48 AM  Result Value Ref Range   aPTT 27 24 - 36 seconds  Urinalysis, Routine w reflex microscopic Urine, Clean Catch     Status: Abnormal   Collection Time: 05/14/21  8:48 AM  Result Value Ref Range   Color, Urine STRAW (A) YELLOW   APPearance CLEAR CLEAR   Specific Gravity, Urine 1.011 1.005 - 1.030   pH 7.0 5.0 - 8.0   Glucose, UA NEGATIVE NEGATIVE mg/dL   Hgb urine dipstick NEGATIVE NEGATIVE   Bilirubin Urine NEGATIVE NEGATIVE   Ketones, ur NEGATIVE NEGATIVE mg/dL   Protein, ur NEGATIVE NEGATIVE mg/dL   Nitrite NEGATIVE NEGATIVE   Leukocytes,Ua NEGATIVE NEGATIVE  Differential     Status: Abnormal   Collection Time: 05/14/21  8:48 AM  Result Value Ref Range   Neutrophils Relative % 46 %   Neutro Abs 1.6 (L) 1.7 - 7.7 K/uL   Lymphocytes Relative 29 %   Lymphs Abs 1.1 0.7 - 4.0 K/uL   Monocytes Relative 14 %   Monocytes Absolute 0.5 0.1 - 1.0 K/uL   Eosinophils Relative 1 %   Eosinophils Absolute 0.1 0.0 - 0.5 K/uL   Basophils Relative 0 %   Basophils Absolute 0.0 0.0 - 0.1 K/uL   RBC Morphology MORPHOLOGY UNREMARKABLE    Immature Granulocytes 10 %   Abs Immature Granulocytes 0.37 (H)  0.00 - 0.07 K/uL   Giant PLTs PRESENT   I-stat chem 8, ED (not at [facility] or [facility])     Status: Abnormal   Collection Time: 05/14/21  8:54 AM  Result Value Ref Range   Sodium 140 135 - 145 mmol/L   Potassium 3.9 3.5 - 5.1 mmol/L   Chloride 104 98 - 111 mmol/L   BUN 20 8 - 23 mg/dL   Creatinine, Ser 1.30 (H) 0.44 - 1.00 mg/dL   Glucose, Bld 201 (H) 70 - 99 mg/dL   Calcium, Ion 1.12 (L) 1.15 - 1.40 mmol/L   TCO2 26 22 - 32 mmol/L   Hemoglobin 10.9 (L) 12.0 - 15.0 g/dL   HCT 32.0 (L) 36.0 - 46.0 %  Respiratory (~20 pathogens) panel by PCR     Status: None   Collection Time: 05/14/21  9:04 AM   Specimen: Nasopharyngeal Swab; Respiratory  Result Value Ref Range   Adenovirus NOT DETECTED NOT DETECTED   Coronavirus 229E NOT DETECTED NOT DETECTED   Coronavirus HKU1 NOT DETECTED NOT DETECTED   Coronavirus NL63 NOT DETECTED NOT DETECTED   Coronavirus OC43 NOT DETECTED NOT DETECTED   Metapneumovirus NOT DETECTED NOT DETECTED   Rhinovirus / Enterovirus NOT DETECTED NOT DETECTED   Influenza A NOT DETECTED NOT DETECTED   Influenza B NOT DETECTED NOT DETECTED   Parainfluenza Virus 1 NOT DETECTED NOT DETECTED   Parainfluenza Virus 2 NOT DETECTED NOT DETECTED   Parainfluenza Virus 3 NOT DETECTED NOT DETECTED   Parainfluenza Virus 4 NOT DETECTED NOT DETECTED   Respiratory Syncytial Virus NOT DETECTED NOT DETECTED   Bordetella pertussis NOT DETECTED NOT DETECTED   Bordetella Parapertussis NOT DETECTED NOT DETECTED   Chlamydophila pneumoniae NOT DETECTED NOT DETECTED   Mycoplasma pneumoniae NOT DETECTED NOT DETECTED  Brain natriuretic peptide     Status: Abnormal   Collection Time: 05/14/21  9:04 AM  Result Value Ref Range   B Natriuretic Peptide 261.7 (H) 0.0 - 100.0 pg/mL  TSH     Status: None   Collection Time: 05/14/21  9:04 AM  Result Value Ref Range   TSH 2.813 0.350 - 4.500 uIU/mL  Lactic acid, plasma     Status: Abnormal   Collection Time: 05/14/21 10:48 AM  Result Value Ref Range    Lactic Acid, Venous 2.5 (HH) 0.5 - 1.9 mmol/L    I have Reviewed nursing notes, Vitals, and Lab results since pt's last encounter. Pertinent lab results : see above I have ordered test including BMP, CBC, Mg I have reviewed the last note from staff over past 24 hours I have discussed pt's care plan and test results with nursing staff, case manager  Assessment and Plan: * SIRS (systemic inflammatory response syndrome) ([place])- (present on admission) - Tachycardia which may be confounded from SVT,  neutropenia, lactic acidosis; no clear source at this time as she does not appear toxic -Given transplant history, reasonable to continue on vancomycin and cefepime while awaiting possible culture results and/or further monitoring - UA negative; RVP negative - follow up covid/flu swab - follow up blood cultures  - hold cellcept in setting of possible infection (I discussed this with Dr. Sherren Mocha at [facility] as well)  Tachycardia - Presented with rapid heart rate in the 140s.  Concern in the ER was for A-fib with RVR although EKG shows what appears to be more of SVT.  Other consideration would be a flutter as noted by cardiology as well -Agree that she needs further monitoring possibly with zio patch or even loop recorder; cardiology recommending follow up with [facility] given her care is there which is reasonable - no recommendation for anticoagulation either unless afib/flutter is definitively seen  - BP also low/normal at baseline and not likely to tolerate cardizem (plus prograf interaction) - tentative plan will be referral to EP at [facility] in case of need for ablation again - follow up echo  History of lung transplant ([place]) - follows with Dr. Sherren Mocha at [facility]; called and discussed case with her on admission as well - holding cellcept as noted above - continue prograf and check level (there is an interaction with cardizem which can cause increased prograf levels, if cardizem is continued) - continue  atovaquone,letermovir, and prednisone - no worrisome respiratory findings or symptoms at this time     Advance Care Planning:   Code Status: Full Code   Consults: Cardiology  Family Communication: daughter  Antimicrobials: Vanc 2/17 >> current Cefepime 2/17 >> current  Consultants: Cardiology  Procedures:    DVT ppx:       Code Status: Full Code    Severity of Illness: The appropriate patient status for this patient is INPATIENT. Inpatient status is judged to be reasonable and necessary in order to provide the required intensity of service to ensure the patient's safety. The patient's presenting symptoms, physical exam findings, and initial radiographic and laboratory data in the context of their chronic comorbidities is felt to place them at high risk for further clinical deterioration. Furthermore, it is not anticipated that the patient will be medically stable for discharge from the hospital within 2 midnights of admission.   * I certify that at the point of admission it is my clinical judgment that the patient will require inpatient hospital care spanning beyond 2 midnights from the point of admission due to high intensity of service, high risk for further deterioration and high frequency of surveillance required.*  Author: Dwyane Dee, MD 05/14/2021 2:05 PM  For on call review [email or web address].

## 2021-05-14 NOTE — ED Notes (Signed)
Dinner tray given

## 2021-05-14 NOTE — Progress Notes (Signed)
A consult was received from an ED physician for Cefepime and Vancomycin per pharmacy dosing.  The patient's profile has been reviewed for ht/wt/allergies/indication/available labs.   A one time order has been placed for Cefepime 2g, Vancomycin 1250mg .  Further antibiotics/pharmacy consults should be ordered by admitting physician if indicated.                       Thank you,  Gretta Arab PharmD, BCPS Clinical Pharmacist [facility] main pharmacy [phone] 05/14/2021 9:28 AM

## 2021-05-14 NOTE — Assessment & Plan Note (Addendum)
-   Presented with rapid heart rate in the 140s.  Concern in the ER was for A-fib with RVR although EKG shows what appears to be more of SVT.  Other consideration would be a flutter as noted by cardiology as well - she is also s/p loop recorder which was recently removed - plan is for outpatient follow up with EP at [facility]. She is comfortable scheduling this appointment and facilitating  -Prescribed Lopressor 25 mg every 6 hours as needed for recurrent tachycardia.  Parameters included on prescription - per cardiology, no recommendation for anticoagulation either unless afib/flutter is definitively seen  - echo showed EF 60-65%, Gr II DD

## 2021-05-14 NOTE — ED Provider Notes (Signed)
[facility] DEPT Provider Note   CSN: [id number] Arrival date & time: 05/14/21  1443     History  Chief Complaint  Patient presents with   Atrial Fibrillation   Chest Pain    Brittany Coleman is a 66 y.o. female.  HPI     66 year old female with history of lung transplant at [facility] in 2021 on antirejection medication, diabetes, hypothyroidism and sarcoidosis comes in with chief complaint of chest pain, shortness of breath.  Patient indicates that she woke up this morning at 6 AM with chest pain.  Patient was having palpitations of her chest.  She also had mild shortness of breath.  She has had A-fib occasionally since her transplant.  Typically her A-fib will resolve within 30 minutes, this episode is not resolving.  Patient had a fever 2 days ago.  She took Tylenol in the morning, subsequently did not have any fevers. She is having   Home Medications Prior to Admission medications   Medication Sig Start Date End Date Taking? Authorizing Provider  acetaminophen (TYLENOL) 500 MG tablet Take 500 mg by mouth every 6 (six) hours as needed for mild pain, moderate pain, fever or headache.   Yes [provider]  albuterol (VENTOLIN HFA) 108 (90 Base) MCG/ACT inhaler Inhale 1-2 puffs into the lungs every 6 (six) hours as needed for shortness of breath or wheezing. 03/07/21  Yes [provider]  atovaquone (MEPRON) 750 MG/5ML suspension Take 1,500 mg by mouth every morning. 04/23/21  Yes [provider]  Azelastine HCl 137 MCG/SPRAY SOLN Place 2 sprays into both nostrils 2 (two) times daily. 04/18/21  Yes [provider]  BREO ELLIPTA 200-25 MCG/ACT AEPB Inhale 1 puff into the lungs daily. 05/13/21  Yes [provider]  esomeprazole (NEXIUM) 40 MG capsule Take 40 mg by mouth 2 (two) times daily. 03/13/21  Yes [provider]  fluticasone (FLONASE) 50 MCG/ACT nasal spray Place 2 sprays into both nostrils  daily. 01/15/21  Yes [provider]  glipiZIDE (GLUCOTROL) 5 MG tablet Take 10 mg by mouth every morning. 02/26/21  Yes [provider]  levothyroxine (SYNTHROID) 50 MCG tablet Take 50 mcg by mouth daily. 02/28/21  Yes [provider]  MELATONIN PO Take 1 tablet by mouth at bedtime as needed (sleep).   Yes [provider]  montelukast (SINGULAIR) 10 MG tablet Take 10 mg by mouth at bedtime. 03/08/21  Yes [provider]  Multiple Vitamins-Minerals (HAIR/SKIN/NAILS/BIOTIN PO) Take 1 capsule by mouth daily.   Yes [provider]  mycophenolate (CELLCEPT) 250 MG capsule Take 250 mg by mouth 2 (two) times daily. 02/17/21  Yes [provider]  ondansetron (ZOFRAN) 4 MG tablet Take 4 mg by mouth every 6 (six) hours as needed for nausea or vomiting. 03/07/21  Yes [provider]  OZEMPIC, 0.25 OR 0.5 MG/DOSE, 2 MG/1.5ML SOPN Inject 0.25 mg into the skin once a week. monday 05/05/21  Yes [provider]  predniSONE (DELTASONE) 5 MG tablet Take 5 mg by mouth daily. continuous 03/09/21  Yes [provider]  PREVYMIS 240 MG TABS Take 480 mg by mouth daily. 03/25/21  Yes [provider]  rosuvastatin (CRESTOR) 10 MG tablet Take 10 mg by mouth at bedtime. 02/28/21  Yes [provider]  tacrolimus (PROGRAF) 0.5 MG capsule Take 0.5 mg by mouth 2 (two) times daily. 02/24/21  Yes [provider]  triamcinolone cream (KENALOG) 0.1 % Apply 1 application topically See admin  instructions. Use as directed 03/07/21  Yes [provider]  valACYclovir (VALTREX) 500 MG tablet Take 500 mg by mouth 2 (two) times daily. 04/16/21  Yes [provider]      Allergies    Dapsone, Levofloxacin, Trimethoprim, Sulfamethoxazole, and Sulfamethoxazole-trimethoprim    Review of Systems   Review of Systems  Physical Exam Updated Vital Signs BP 106/65    Pulse 66    Temp 98 F (36.7 C) (Oral)    Resp 12     Ht 5\' 4"  (1.626 m)    Wt 72.6 kg    SpO2 100%    BMI 27.46 kg/m  Physical Exam  ED Results / Procedures / Treatments   Labs (all labs ordered are listed, but only abnormal results are displayed) Labs Reviewed  CBC - Abnormal; Notable for the following components:      Result Value   WBC 3.5 (*)    RBC 3.35 (*)    Hemoglobin 11.2 (*)    HCT 34.4 (*)    MCV 102.7 (*)    All other components within normal limits  LACTIC ACID, PLASMA - Abnormal; Notable for the following components:   Lactic Acid, Venous 2.2 (*)    All other components within normal limits  LACTIC ACID, PLASMA - Abnormal; Notable for the following components:   Lactic Acid, Venous 2.5 (*)    All other components within normal limits  COMPREHENSIVE METABOLIC PANEL - Abnormal; Notable for the following components:   Glucose, Bld 207 (*)    Creatinine, Ser 1.28 (*)    GFR, Estimated 47 (*)    All other components within normal limits  BRAIN NATRIURETIC PEPTIDE - Abnormal; Notable for the following components:   B Natriuretic Peptide 261.7 (*)    All other components within normal limits  DIFFERENTIAL - Abnormal; Notable for the following components:   Neutro Abs 1.6 (*)    Abs Immature Granulocytes 0.37 (*)    All other components within normal limits  CULTURE, BLOOD (ROUTINE X 2)  CULTURE, BLOOD (ROUTINE X 2)  URINE CULTURE  RESPIRATORY PANEL BY PCR  MAGNESIUM  PROTIME-INR  APTT  TSH  URINALYSIS, ROUTINE W REFLEX MICROSCOPIC  APTT  HEPARIN LEVEL (UNFRACTIONATED)  I-STAT CHEM 8, ED  I-STAT CHEM 8, ED    EKG None  Radiology DG Chest Port 1 View  Result Date: 05/14/2021 CLINICAL DATA:  Chest pain. EXAM: PORTABLE CHEST 1 VIEW COMPARISON:  None. FINDINGS: The heart size and mediastinal contours are within normal limits. Both lungs are clear. The visualized skeletal structures are unremarkable. Surgical wires are seen projected over the cardiomediastinal silhouette consistent with history of lung  transplant. IMPRESSION: No active disease. Electronically Signed   By: Marijo Conception M.D.   On: 05/14/2021 09:09    Procedures .Critical Care Performed by: Varney Biles, MD Authorized by: Varney Biles, MD   Critical care provider statement:    Critical care time (minutes):  80   Critical care was necessary to treat or prevent imminent or life-threatening deterioration of the following conditions:  Circulatory failure   Critical care was time spent personally by me on the following activities:  Development of treatment plan with patient or surrogate, discussions with consultants, evaluation of patient's response to treatment, examination of patient, ordering and review of laboratory studies, ordering and review of radiographic studies, ordering and performing treatments and interventions, pulse oximetry, re-evaluation of patient's condition and review of old charts    Medications Ordered in  ED Medications  vancomycin (VANCOREADY) IVPB 1250 mg/250 mL (1,250 mg Intravenous New Bag/Given 05/14/21 1106)  diltiazem (CARDIZEM) 1 mg/mL load via infusion 10 mg (10 mg Intravenous Bolus from Bag 05/14/21 1008)    And  diltiazem (CARDIZEM) 125 mg in dextrose 5% 125 mL (1 mg/mL) infusion (5 mg/hr Intravenous New Bag/Given 05/14/21 1008)  lactated ringers infusion ( Intravenous New Bag/Given 05/14/21 1047)  heparin ADULT infusion 100 units/mL (25000 units/222mL) (1,100 Units/hr Intravenous New Bag/Given 05/14/21 1043)  lactated ringers bolus 1,000 mL (0 mLs Intravenous Stopped 05/14/21 1014)  ceFEPIme (MAXIPIME) 2 g in sodium chloride 0.9 % 100 mL IVPB (0 g Intravenous Stopped 05/14/21 1011)  metroNIDAZOLE (FLAGYL) IVPB 500 mg (0 mg Intravenous Stopped 05/14/21 1108)  lactated ringers bolus 500 mL (0 mLs Intravenous Stopped 05/14/21 1047)  diltiazem (CARDIZEM) tablet 30 mg (30 mg Oral Given 05/14/21 1106)    ED Course/ Medical Decision Making/ A&P Clinical Course as of 05/14/21 1116  Fri May 14, 2021   1015 Discussed case with transplant team at [facility]. Informed them about the current presentation, fever 2 days ago, and initial work-up which is reassuring.  They think patient can be kept at [facility].  They advised that antibiotics discontinued as needed. They advised A-fib management and recommendation is normal, and for patient to contact her transplant coordinator at the time of discharge to set up outpatient follow-up with cardiology within the [facility] system. [AN]  1027 Discussed case with cardiology service. Gay Filler informed me that patient will be put on the rounding list and patient can stay at [facility] long. [AN]  1028 Patient had an isolated episode of hypotension and some chest pain.  Patient put in Trendelenburg position.  Diltiazem has already been initiated.  Blood pressure did improve, chest pain resolved.  Heparin initiated. [place] Vasc is 2. [TF]  5732 Patient now in sinus rhythm with heart rate in the 60s.  Ordering oral diltiazem.  Nursing staff will discontinue the IV diltiazem in 30 minutes after the oral agent is given.  This communication has been provided directly to the primary nurse.  Hospitalist will admit. [AN]    Clinical Course User Index [AN] Varney Biles, MD       CHA2DS2-VASc Score: 2                    Medical Decision Making Amount and/or Complexity of Data Reviewed Labs: ordered. Radiology: ordered.  Risk Prescription drug management. Decision regarding hospitalization.   This patient presents to the ED with chief complaint(s) of chest pain and shortness of with pertinent past medical history of transplant history of the lung with immunosuppression on board, prior history of DVT and an episode of fever 2 days ago which further complicates the presenting complaint. The complaint involves an extensive differential diagnosis and treatment options and also carries with it a high risk of complications and morbidity.    The differential  diagnosis includes : Primary new A-fib with RVR secondary to underlying infection/sepsis, structural change to the heart because of her lung disease, new valvular disorder/CHF, medication side effects.  The initial plan is to initiate broad work-up for new onset A-fib which would include cardiac labs and also initiate code sepsis.  IV fluids also ordered.  We will initiate rate control with IV diltiazem.  We will also connect with transplant team.   Final Clinical Impression(s) / ED Diagnoses Final diagnoses:  Atrial fibrillation with RVR ([place])    Rx / DC  Orders ED Discharge Orders          Ordered    Amb referral to AFIB Clinic        05/14/21 0847              Varney Biles, MD 05/14/21 [phone]

## 2021-05-14 NOTE — Assessment & Plan Note (Addendum)
-   follows with Dr. Sherren Mocha at [facility]; called and discussed case with her on admission as well - holding cellcept as noted above but resumed at discharge given overall negative workup  - prograf level still in process at discharge

## 2021-05-14 NOTE — ED Triage Notes (Signed)
Little after 6 am chest pain, arrived to ED with AFib RVR

## 2021-05-14 NOTE — Assessment & Plan Note (Addendum)
-   Tachycardia which may be confounded from SVT, neutropenia (which appears to be at her baseline after further review), lactic acidosis; no clear source at this time as she does not appear toxic - treated with vanc/cefepime empirically - no obvious etiology for infection with further monitoring in the hospital.  Likely a rapid resolving viral illness -CellCept resumed at discharge - UA negative, Urine culture negative; RVP and Covid/flu negative - Blood cultures negative but still in process at d/c

## 2021-05-14 NOTE — Progress Notes (Signed)
Attempted to do nursing admission history. Pt on telephone and remains speaking on it. Unable to complete nursing admission hx at this time. Lucius Conn BSN, RN-BC Admissions RN 05/14/2021 8:07 PM

## 2021-05-14 NOTE — ED Notes (Signed)
Lunch tray given. 

## 2021-05-14 NOTE — ED Notes (Signed)
PT resting on stretcher with spouse by bedside. NAD, VSS, and awaiting further care plans

## 2021-05-14 NOTE — Hospital Course (Addendum)
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  She lives in [place] and is in town in [place] visiting her daughter.  She also has a history of postop A-fib and self-reported episodes of rapid heart rate at least 3 times over the past year when at home but did not seek medical attention.  It is unknown if these were PVCs or recurrent A-fib.  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  She had an IVC filter in the past which has also since been removed.  Also appears that she had a previous loop recorder which was recently removed. Cardiology was consulted in the ER as she was suspected to be in A-fib with RVR. EKGs however showed rapid rate but rhythm did not appear to be consistent with a fib. Other consideration was that her rhythm was an SVT or possibly flutter.  She was started on a cardizem and heparin drip on admission.  Her case was also discussed with her transplant team on admission. Ultimately, her rate became controlled.  After discussion with her transplant physician, Cardizem was discontinued.  Cardiology recommended PRN Lopressor at discharge in case of recurrent tachycardia.  They also recommended for patient to follow-up with EP at [facility] to consider possible need for ablation and further work-up.  Patient voiced understanding to recommendations.  Rate remained controlled and in normal sinus rhythm at time of discharge.  In regards to her infectious work-up, this remained negative.  COVID swab and RVP panel were negative.  Blood cultures remained negative.  Urinalysis negative on admission with negative culture.  She was treated empirically with vancomycin and cefepime for 24 hours.  Etiology of her presentation was most likely a rapid viral  syndrome which resolved on its own.  She had no true fevers during hospitalization.  WBC remained around 3-3.5 and after reviewing her prior lab results on her iPad, this is consistent with her baseline mild neutropenia.  CellCept which had been on hold on admission was resumed at discharge. Prograf level also pending at time of discharge and she understands to have this followed up outpatient as well.  Husband present and she was discharged home in stable condition.

## 2021-05-14 NOTE — Progress Notes (Signed)
ANTICOAGULATION CONSULT NOTE - Initial Consult  Pharmacy Consult for IV heparin Indication: atrial fibrillation  Allergies  Allergen Reactions   Dapsone Anaphylaxis    Methemoglobinemia    Levofloxacin Swelling    Ankle edema   Trimethoprim     Other reaction(s): Unknown   Sulfamethoxazole Itching and Rash   Sulfamethoxazole-Trimethoprim Itching and Rash    Patient Measurements: Height: 5\' 4"  (162.6 cm) Weight: 72.6 kg (160 lb) IBW/kg (Calculated) : 54.7 Heparin Dosing Weight:   Vital Signs: Temp: 98 F (36.7 C) (02/17 0835) Temp Source: Oral (02/17 0835) BP: 100/60 (02/17 1130) Pulse Rate: 68 (02/17 1130)  Labs: Recent Labs    05/14/21 0848  HGB 11.2*  HCT 34.4*  PLT 151  APTT 27  LABPROT 12.8  INR 1.0  CREATININE 1.28*     Estimated Creatinine Clearance: 43.4 mL/min (A) (by C-G formula based on SCr of 1.28 mg/dL (H)).   Medical History: Past Medical History:  Diagnosis Date   Basal cell carcinoma    Diabetes mellitus without complication ([place])    Hyperlipemia    Immunosuppression ([place])    Pulmonary sarcoidosis ([facility])    Sleep apnea    Thyroid disease     Medications:  Scheduled:  Infusions:   diltiazem (CARDIZEM) infusion Stopped (05/14/21 1135)   heparin 1,100 Units/hr (05/14/21 1043)   lactated ringers 125 mL/hr at 05/14/21 1047   vancomycin 1,250 mg (05/14/21 1106)    Assessment: 66 yo presented with Afib and CP with hx lung tx at [facility] in 2021 on mycophenolate/tacrolimus. To start IV heparin per Md order for now. Baseline aPTT 27. Note that patient previously on Eliquis and had IVC filter placed for hx DVT from May 2021 until it was stopped June 2022 after resolution of clots per scans and IVC was subsequently removed Aug 2022 as well.   Goal of Therapy:  Heparin level 0.3-0.7 units/ml Heparin level 66-102 units/ml Monitor platelets by anticoagulation protocol: Yes   Plan:  NO IV Heparin bolus Start IV heparin at rate of 1100  units/hr Check heparin level/aPTT 6 hours after start of IV heparin Daily CBC  Kara Mead 05/14/2021,12:19 PM   Addendum:   A/P: After further looking at patient's records and med rec being completed, patient has not been on apixaban since June of 2022. As a result, will now order a bolus to be given of 2000 units   Adrian Saran, PharmD, BCPS Secure Chat if ?s 05/14/2021 12:21 PM

## 2021-05-14 NOTE — Progress Notes (Signed)
°*  PRELIMINARY RESULTS* Echocardiogram 2D Echocardiogram has been performed.  Brittany Coleman 05/14/2021, 2:13 PM

## 2021-05-14 NOTE — Progress Notes (Signed)
Pharmacy Antibiotic Note  Brittany Coleman is a 66 y.o. female admitted on 05/14/2021 with sepsis.  Pharmacy has been consulted for vanc/cefepime dosing.  Plan: Vanc 1250mg  IV x 1 already given in ER, start vanc 1g IV q24 thereafter - goal AUC 400-550 Cefepime 2g IV q12 per current renal function  Height: 5\' 4"  (162.6 cm) Weight: 72.6 kg (160 lb) IBW/kg (Calculated) : 54.7  Temp (24hrs), Avg:98 F (36.7 C), Min:98 F (36.7 C), Max:98 F (36.7 C)  Recent Labs  Lab 05/14/21 0848 05/14/21 0854 05/14/21 1048  WBC 3.5*  --   --   CREATININE 1.28* 1.30*  --   LATICACIDVEN 2.2*  --  2.5*    Estimated Creatinine Clearance: 42.7 mL/min (A) (by C-G formula based on SCr of 1.3 mg/dL (H)).    Allergies  Allergen Reactions   Dapsone Anaphylaxis    Methemoglobinemia    Levofloxacin Swelling    Ankle edema   Trimethoprim     Other reaction(s): Unknown   Sulfamethoxazole Itching and Rash   Sulfamethoxazole-Trimethoprim Itching and Rash     Thank you for allowing pharmacy to be a part of this patients care.  Kara Mead 05/14/2021 2:02 PM

## 2021-05-15 ENCOUNTER — Other Ambulatory Visit: Payer: Self-pay

## 2021-05-15 DIAGNOSIS — R6510 Systemic inflammatory response syndrome (SIRS) of non-infectious origin without acute organ dysfunction: Secondary | ICD-10-CM

## 2021-05-15 LAB — CBC WITH DIFFERENTIAL/PLATELET

## 2021-05-15 LAB — CBC
HCT: 30.6 % — ABNORMAL LOW (ref 36.0–46.0)
Hemoglobin: 9.9 g/dL — ABNORMAL LOW (ref 12.0–15.0)
MCH: 34.3 pg — ABNORMAL HIGH (ref 26.0–34.0)
MCHC: 32.4 g/dL (ref 30.0–36.0)
MCV: 105.9 fL — ABNORMAL HIGH (ref 80.0–100.0)
Platelets: 119 10*3/uL — ABNORMAL LOW (ref 150–400)
RBC: 2.89 MIL/uL — ABNORMAL LOW (ref 3.87–5.11)
RDW: 15.1 % (ref 11.5–15.5)
WBC: 3.1 10*3/uL — ABNORMAL LOW (ref 4.0–10.5)
nRBC: 0 % (ref 0.0–0.2)

## 2021-05-15 LAB — DIFFERENTIAL
Abs Immature Granulocytes: 0.05 10*3/uL (ref 0.00–0.07)
Basophils Absolute: 0 10*3/uL (ref 0.0–0.1)
Basophils Relative: 0 %
Eosinophils Absolute: 0 10*3/uL (ref 0.0–0.5)
Eosinophils Relative: 1 %
Immature Granulocytes: 2 %
Lymphocytes Relative: 24 %
Lymphs Abs: 0.8 10*3/uL (ref 0.7–4.0)
Monocytes Absolute: 0.6 10*3/uL (ref 0.1–1.0)
Monocytes Relative: 18 %
Neutro Abs: 1.7 10*3/uL (ref 1.7–7.7)
Neutrophils Relative %: 55 %

## 2021-05-15 LAB — URINE CULTURE: Culture: NO GROWTH

## 2021-05-15 MED ORDER — METOPROLOL TARTRATE 25 MG PO TABS: 25.0000 mg | ORAL_TABLET | Freq: Four times a day (QID) | ORAL | 2 refills | Status: AC | PRN

## 2021-05-15 NOTE — Discharge Summary (Signed)
Physician Discharge Summary   Patient: Brittany Coleman MRN: [id number] DOB: 04/26/1955  Admit date:     05/14/2021  Discharge date: 05/15/21  Discharge Physician: Dwyane Dee   PCP: [facility]   Recommendations at discharge:   Follow up with [facility] EP Follow up with transplant team as needed Follow up Prograf level in process at discharge  Discharge Diagnoses: Active Problems:   History of lung transplant (Brittany Coleman)  Principal Problem (Resolved):   SIRS (systemic inflammatory response syndrome) (Brittany Coleman) Resolved Problems:   Tachycardia   Hospital Course: 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  She lives in [place] and is in town in [place] visiting her daughter.  She also has a history of postop A-fib and self-reported episodes of rapid heart rate at least 3 times over the past year when at home but did not seek medical attention.  It is unknown if these were PVCs or recurrent A-fib.  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  She had an IVC filter in the past which has also since been removed.  Also appears that she had a previous loop recorder which was recently removed. Cardiology was consulted in the ER as she was suspected to be in A-fib with RVR. EKGs however showed rapid rate but rhythm did not appear to be consistent with a fib. Other consideration was that her rhythm was an SVT or possibly flutter.  She was started on a cardizem and heparin drip on admission.  Her case was also discussed with her transplant team on admission. Ultimately, her rate became controlled.  After discussion with her transplant physician, Cardizem was discontinued.  Cardiology recommended PRN Lopressor at discharge in case of  recurrent tachycardia.  They also recommended for patient to follow-up with EP at [facility] to consider possible need for ablation and further work-up.  Patient voiced understanding to recommendations.  Rate remained controlled and in normal sinus rhythm at time of discharge.  In regards to her infectious work-up, this remained negative.  COVID swab and RVP panel were negative.  Blood cultures remained negative.  Urinalysis negative on admission with negative culture.  She was treated empirically with vancomycin and cefepime for 24 hours.  Etiology of her presentation was most likely a rapid viral syndrome which resolved on its own.  She had no true fevers during hospitalization.  WBC remained around 3-3.5 and after reviewing her prior lab results on her iPad, this is consistent with her baseline mild neutropenia.  CellCept which had been on hold on admission was resumed at discharge. Prograf level also pending at time of discharge and she understands to have this followed up outpatient as well.  Husband present and she was discharged home in stable condition.  Assessment and Plan: * SIRS (systemic inflammatory response syndrome) (HCC)-resolved as of 05/15/2021, (present on admission) - Tachycardia which may be confounded from SVT, neutropenia (which appears to be at her baseline after further review), lactic acidosis; no clear source at this time as she does not appear toxic - treated with vanc/cefepime empirically - no obvious etiology for infection with further monitoring in the hospital.  Likely a rapid resolving viral illness -CellCept resumed at discharge - UA negative, Urine culture negative; RVP and  Covid/flu negative - Blood cultures negative but still in process at d/c  Tachycardia-resolved as of 05/15/2021 - Presented with rapid heart rate in the 140s.  Concern in the ER was for A-fib with RVR although EKG shows what appears to be more of SVT.  Other consideration would be a flutter as noted by  cardiology as well - she is also s/p loop recorder which was recently removed - plan is for outpatient follow up with EP at [facility]. She is comfortable scheduling this appointment and facilitating  -Prescribed Lopressor 25 mg every 6 hours as needed for recurrent tachycardia.  Parameters included on prescription - per cardiology, no recommendation for anticoagulation either unless afib/flutter is definitively seen  - echo showed EF 60-65%, Gr II DD   History of lung transplant (Brittany Coleman) - follows with Brittany Coleman at [facility]; called and discussed case with her on admission as well - holding cellcept as noted above but resumed at discharge given overall negative workup  - prograf level still in process at discharge     Consultants: Cardiology Procedures performed:   Disposition: Home Diet recommendation:  Discharge Diet Orders (From admission, onward)     Start     Ordered   05/15/21 0000  Diet general        05/15/21 1043           Regular diet  DISCHARGE MEDICATION: Allergies as of 05/15/2021       Reactions   Dapsone Anaphylaxis   Methemoglobinemia   Levofloxacin Swelling   Ankle edema   Trimethoprim    Other reaction(s): Unknown   Sulfamethoxazole Itching, Rash   Sulfamethoxazole-trimethoprim Itching, Rash        Medication List     TAKE these medications    acetaminophen 500 MG tablet Commonly known as: TYLENOL Take 500 mg by mouth every 6 (six) hours as needed for mild pain, moderate pain, fever or headache.   albuterol 108 (90 Base) MCG/ACT inhaler Commonly known as: VENTOLIN HFA Inhale 1-2 puffs into the lungs every 6 (six) hours as needed for shortness of breath or wheezing.   atovaquone 750 MG/5ML suspension Commonly known as: MEPRON Take 1,500 mg by mouth every morning.   Azelastine HCl 137 MCG/SPRAY Soln Place 2 sprays into both nostrils 2 (two) times daily.   Breo Ellipta 200-25 MCG/ACT Aepb Generic drug: fluticasone furoate-vilanterol Inhale 1 puff  into the lungs daily.   esomeprazole 40 MG capsule Commonly known as: NEXIUM Take 40 mg by mouth 2 (two) times daily.   fluticasone 50 MCG/ACT nasal spray Commonly known as: FLONASE Place 2 sprays into both nostrils daily.   glipiZIDE 5 MG tablet Commonly known as: GLUCOTROL Take 10 mg by mouth every morning.   HAIR/SKIN/NAILS/BIOTIN PO Take 1 capsule by mouth daily.   levothyroxine 50 MCG tablet Commonly known as: SYNTHROID Take 50 mcg by mouth daily.   MELATONIN PO Take 1 tablet by mouth at bedtime as needed (sleep).   metoprolol tartrate 25 MG tablet Commonly known as: LOPRESSOR Take 1 tablet (25 mg total) by mouth every 6 (six) hours as needed. For heart reate > 115 only. Hold if blood pressure is less than 90/60   montelukast 10 MG tablet Commonly known as: SINGULAIR Take 10 mg by mouth at bedtime.   mycophenolate 250 MG capsule Commonly known as: CELLCEPT Take 250 mg by mouth 2 (two) times daily.   ondansetron 4 MG tablet Commonly known as: ZOFRAN Take 4 mg by mouth every 6 (  six) hours as needed for nausea or vomiting.   Ozempic (0.25 or 0.5 MG/DOSE) 2 MG/1.5ML Sopn Generic drug: Semaglutide(0.25 or 0.5MG /DOS) Inject 0.25 mg into the skin once a week. monday   predniSONE 5 MG tablet Commonly known as: DELTASONE Take 5 mg by mouth daily. continuous   Prevymis 240 MG Tabs Generic drug: Letermovir Take 480 mg by mouth daily.   rosuvastatin 10 MG tablet Commonly known as: CRESTOR Take 10 mg by mouth at bedtime.   tacrolimus 0.5 MG capsule Commonly known as: PROGRAF Take 0.5 mg by mouth 2 (two) times daily.   triamcinolone cream 0.1 % Commonly known as: KENALOG Apply 1 application topically See admin instructions. Use as directed   valACYclovir 500 MG tablet Commonly known as: VALTREX Take 500 mg by mouth 2 (two) times daily.         Discharge Exam: Filed Weights   05/14/21 0847 05/14/21 2353  Weight: 72.6 kg 77.6 kg   Physical  Exam Constitutional:      General: She is not in acute distress.    Appearance: She is well-developed. She is not ill-appearing.  HENT:     Head: Normocephalic and atraumatic.     Mouth/Throat:     Mouth: Mucous membranes are moist.  Eyes:     Extraocular Movements: Extraocular movements intact.  Cardiovascular:     Rate and Rhythm: Normal rate and regular rhythm.  Pulmonary:     Effort: Pulmonary effort is normal. No respiratory distress.     Breath sounds: Normal breath sounds. No wheezing or rhonchi.  Abdominal:     General: Bowel sounds are normal. There is no distension.     Palpations: Abdomen is soft.     Tenderness: There is no abdominal tenderness.  Musculoskeletal:        General: No swelling. Normal range of motion.     Cervical back: Normal range of motion and neck supple.  Skin:    General: Skin is warm and dry.  Neurological:     General: No focal deficit present.     Mental Status: She is alert.  Psychiatric:        Mood and Affect: Mood normal.        Behavior: Behavior normal.     Condition at discharge: stable  The results of significant diagnostics from this hospitalization (including imaging, microbiology, ancillary and laboratory) are listed below for reference.   Imaging Studies: DG Chest Port 1 View  Result Date: 05/14/2021 CLINICAL DATA:  Chest pain. EXAM: PORTABLE CHEST 1 VIEW COMPARISON:  None. FINDINGS: The heart size and mediastinal contours are within normal limits. Both lungs are clear. The visualized skeletal structures are unremarkable. Surgical wires are seen projected over the cardiomediastinal silhouette consistent with history of lung transplant. IMPRESSION: No active disease. Electronically Signed   By: Marijo Conception M.D.   On: 05/14/2021 09:09   ECHOCARDIOGRAM COMPLETE  Result Date: 05/14/2021    ECHOCARDIOGRAM REPORT   Patient Name:   CAMILE ESTERS Date of Exam: 05/14/2021 Medical Rec #:  [id number]          Height:       64.0 in  Accession #:    [id number]         Weight:       160.0 lb Date of Birth:  1956-02-09           BSA:          1.779 m Patient Age:  65 years           BP:           108/68 mmHg Patient Gender: F                  HR:           64 bpm. Exam Location:  Inpatient Procedure: 2D Echo, Cardiac Doppler and Color Doppler Indications:    I48.91 AFIB  History:        Patient has no prior history of Echocardiogram examinations.                 Risk Factors:Diabetes and Dyslipidemia. PULM. SARCOIDOSIS /                 BASAL CELL CARCINOMA.  Sonographer:    Beryle Beams Referring Phys: [id number] [place]  1. Left ventricular ejection fraction, by estimation, is 60 to 65%. The left ventricle has normal function. The left ventricle has no regional wall motion abnormalities. There is mild left ventricular hypertrophy. Left ventricular diastolic parameters are consistent with Grade II diastolic dysfunction (pseudonormalization).  2. Right ventricular systolic function is normal. The right ventricular size is normal. Tricuspid regurgitation signal is inadequate for assessing PA pressure.  3. Left atrial size was mild to moderately dilated.  4. The mitral valve is normal in structure. Trivial mitral valve regurgitation. No evidence of mitral stenosis.  5. The aortic valve is tricuspid. Aortic valve regurgitation is not visualized. Aortic valve sclerosis/calcification is present, without any evidence of aortic stenosis.  6. The inferior vena cava is dilated in size with >50% respiratory variability, suggesting right atrial pressure of 8 mmHg. FINDINGS  Left Ventricle: Left ventricular ejection fraction, by estimation, is 60 to 65%. The left ventricle has normal function. The left ventricle has no regional wall motion abnormalities. The left ventricular internal cavity size was normal in size. There is  mild left ventricular hypertrophy. Left ventricular diastolic parameters are consistent with Grade II diastolic  dysfunction (pseudonormalization). Right Ventricle: The right ventricular size is normal. No increase in right ventricular wall thickness. Right ventricular systolic function is normal. Tricuspid regurgitation signal is inadequate for assessing PA pressure. Left Atrium: Left atrial size was mild to moderately dilated. Right Atrium: Right atrial size was normal in size. Pericardium: There is no evidence of pericardial effusion. Mitral Valve: The mitral valve is normal in structure. There is mild calcification of the mitral valve leaflet(s). Mild mitral annular calcification. Trivial mitral valve regurgitation. No evidence of mitral valve stenosis. MV peak gradient, 72.6 mmHg. The mean mitral valve gradient is 51.0 mmHg. Tricuspid Valve: The tricuspid valve is normal in structure. Tricuspid valve regurgitation is not demonstrated. Aortic Valve: The aortic valve is tricuspid. Aortic valve regurgitation is not visualized. Aortic valve sclerosis/calcification is present, without any evidence of aortic stenosis. Aortic valve mean gradient measures 3.0 mmHg. Aortic valve peak gradient measures 5.4 mmHg. Aortic valve area, by VTI measures 2.27 cm. Pulmonic Valve: The pulmonic valve was normal in structure. Pulmonic valve regurgitation is trivial. Aorta: The aortic root is normal in size and structure. Venous: The inferior vena cava is dilated in size with greater than 50% respiratory variability, suggesting right atrial pressure of 8 mmHg. IAS/Shunts: No atrial level shunt detected by color flow Doppler.  LEFT VENTRICLE PLAX 2D LVIDd:         3.60 cm     Diastology LVIDs:         1.60 cm  LV e' medial:    5.00 cm/s LV PW:         1.00 cm     LV E/e' medial:  19.6 LV IVS:        1.00 cm     LV e' lateral:   8.81 cm/s LVOT diam:     1.80 cm     LV E/e' lateral: 11.1 LV SV:         62 LV SV Index:   35 LVOT Area:     2.54 cm  LV Volumes (MOD) LV vol d, MOD A2C: 42.0 ml LV vol d, MOD A4C: 34.6 ml LV vol s, MOD A2C: 13.0 ml  LV vol s, MOD A4C: 13.5 ml LV SV MOD A2C:     29.0 ml LV SV MOD A4C:     34.6 ml LV SV MOD BP:      26.2 ml RIGHT VENTRICLE            IVC RV S prime:     8.49 cm/s  IVC diam: 2.20 cm RVOT diam:      2.20 cm TAPSE (M-mode): 1.1 cm LEFT ATRIUM             Index        RIGHT ATRIUM           Index LA diam:        3.70 cm 2.08 cm/m   RA Area:     10.00 cm LA Vol (A2C):   50.5 ml 28.38 ml/m  RA Volume:   20.90 ml  11.75 ml/m LA Vol (A4C):   57.4 ml 32.26 ml/m LA Biplane Vol: 53.9 ml 30.29 ml/m  AORTIC VALVE                    PULMONIC VALVE AV Area (Vmax):    1.94 cm     PV Vmax:       0.47 m/s AV Area (Vmean):   2.05 cm     PV Vmean:      30.500 cm/s AV Area (VTI):     2.27 cm     PV VTI:        0.083 m AV Vmax:           116.00 cm/s  PV Peak grad:  0.9 mmHg AV Vmean:          83.000 cm/s  PV Mean grad:  0.0 mmHg AV VTI:            0.273 m AV Peak Grad:      5.4 mmHg AV Mean Grad:      3.0 mmHg LVOT Vmax:         88.30 cm/s LVOT Vmean:        67.000 cm/s LVOT VTI:          0.244 m LVOT/AV VTI ratio: 0.89  AORTA Ao Root diam: 3.20 cm Ao Asc diam:  3.20 cm MITRAL VALVE MV Area (PHT): 3.77 cm    SHUNTS MV Area VTI:   0.41 cm    Systemic VTI:  0.24 m MV Peak grad:  72.6 mmHg   Systemic Diam: 1.80 cm MV Mean grad:  51.0 mmHg   Pulmonic Diam: 2.20 cm MV Vmax:       4.26 m/s MV Vmean:      338.0 cm/s MV Decel Time: 201 msec MV E velocity: 97.90 cm/s MV A velocity: 66.70 cm/s MV E/A ratio:  1.47 Dalton McleanMD  Electronically signed by Franki Monte Signature Date/Time: 05/14/2021/3:57:28 PM    Final     Microbiology: Results for orders placed or performed during the hospital encounter of 05/14/21  Blood Culture (routine x 2)     Status: None (Preliminary result)   Collection Time: 05/14/21  8:48 AM   Specimen: BLOOD  Result Value Ref Range Status   Specimen Description   Final    BLOOD RIGHT ANTECUBITAL Performed at [facility] [address] [place], [place] [address]    Special  Requests   Final    BOTTLES DRAWN AEROBIC AND ANAEROBIC Blood Culture adequate volume Performed at [facility] [address], [place], [place] [address]    Culture   Final    NO GROWTH < 24 HOURS Performed at [facility] [address], [place], [place] [address]    Report Status PENDING  Incomplete  Urine Culture     Status: None   Collection Time: 05/14/21  8:48 AM   Specimen: In/Out Cath Urine  Result Value Ref Range Status   Specimen Description   Final    IN/OUT CATH URINE Performed at [facility] [address] [place], [place] [address]    Special Requests   Final    NONE Performed at [facility], [place] [address] [place], [place] [address]    Culture   Final    NO GROWTH Performed at [facility], [place] [address], [place],  [address]    Report Status 05/15/2021 FINAL  Final  Respiratory (~20 pathogens) panel by PCR     Status: None   Collection Time: 05/14/21  9:04 AM   Specimen: Nasopharyngeal Swab; Respiratory  Result Value Ref Range Status   Adenovirus NOT DETECTED NOT DETECTED Final   Coronavirus 229E NOT DETECTED NOT DETECTED Final    Comment: (NOTE) The Coronavirus on the Respiratory Panel, DOES NOT test for the novel  Coronavirus (2019 nCoV)    Coronavirus HKU1 NOT DETECTED NOT DETECTED Final   Coronavirus NL63 NOT DETECTED NOT DETECTED Final   Coronavirus OC43 NOT DETECTED NOT DETECTED Final   Metapneumovirus NOT DETECTED NOT DETECTED Final   Rhinovirus / Enterovirus NOT DETECTED NOT DETECTED Final   Influenza A NOT DETECTED NOT DETECTED Final   Influenza B NOT DETECTED NOT DETECTED Final   Parainfluenza Virus 1 NOT DETECTED NOT DETECTED Final   Parainfluenza Virus 2 NOT DETECTED NOT DETECTED Final   Parainfluenza Virus 3 NOT DETECTED NOT DETECTED Final   Parainfluenza Virus 4 NOT DETECTED NOT DETECTED Final   Respiratory Syncytial Virus NOT DETECTED NOT DETECTED Final    Bordetella pertussis NOT DETECTED NOT DETECTED Final   Bordetella Parapertussis NOT DETECTED NOT DETECTED Final   Chlamydophila pneumoniae NOT DETECTED NOT DETECTED Final   Mycoplasma pneumoniae NOT DETECTED NOT DETECTED Final    Comment: Performed at [facility], [place] [address], [place],  [address]  Resp Panel by RT-PCR (Flu A&B, Covid) Nasopharyngeal Swab     Status: None   Collection Time: 05/14/21  2:06 PM   Specimen: Nasopharyngeal Swab; Nasopharyngeal(NP) swabs in vial transport medium  Result Value Ref Range Status   SARS Coronavirus 2 by RT PCR NEGATIVE NEGATIVE Final    Comment: (NOTE) SARS-CoV-2 target nucleic acids are NOT DETECTED.  The SARS-CoV-2 RNA is generally detectable in upper respiratory specimens during the acute phase of infection. The lowest concentration of SARS-CoV-2 viral copies this  assay can detect is 138 copies/mL. A negative result does not preclude SARS-Cov-2 infection and should not be used as the sole basis for treatment or other patient management decisions. A negative result may occur with  improper specimen collection/handling, submission of specimen other than nasopharyngeal swab, presence of viral mutation(s) within the areas targeted by this assay, and inadequate number of viral copies(<138 copies/mL). A negative result must be combined with clinical observations, patient history, and epidemiological information. The expected result is Negative.  Fact Sheet for Patients:  EntrepreneurPulse.com.au  Fact Sheet for Healthcare Providers:  IncredibleEmployment.be  This test is no t yet approved or cleared by the [place] FDA and  has been authorized for detection and/or diagnosis of SARS-CoV-2 by FDA under an Emergency Use Authorization (EUA). This EUA will remain  in effect (meaning this test can be used) for the duration of the COVID-19 declaration under Section 564(b)(1) of the Act,  21 U.S.C.section 360bbb-3(b)(1), unless the authorization is terminated  or revoked sooner.       Influenza A by PCR NEGATIVE NEGATIVE Final   Influenza B by PCR NEGATIVE NEGATIVE Final    Comment: (NOTE) The Xpert Xpress SARS-CoV-2/FLU/RSV plus assay is intended as an aid in the diagnosis of influenza from Nasopharyngeal swab specimens and should not be used as a sole basis for treatment. Nasal washings and aspirates are unacceptable for Xpert Xpress SARS-CoV-2/FLU/RSV testing.  Fact Sheet for Patients: EntrepreneurPulse.com.au  Fact Sheet for Healthcare Providers: IncredibleEmployment.be  This test is not yet approved or cleared by the [place] FDA and has been authorized for detection and/or diagnosis of SARS-CoV-2 by FDA under an Emergency Use Authorization (EUA). This EUA will remain in effect (meaning this test can be used) for the duration of the COVID-19 declaration under Section 564(b)(1) of the Act, 21 U.S.C. section 360bbb-3(b)(1), unless the authorization is terminated or revoked.  Performed at [facility], [place] [address], [place], [place] [address]   MRSA Next Gen by PCR, Nasal     Status: None   Collection Time: 05/14/21  2:27 PM   Specimen: Nasal Mucosa; Nasal Swab  Result Value Ref Range Status   MRSA by PCR Next Gen NOT DETECTED NOT DETECTED Final    Comment: (NOTE) The GeneXpert MRSA Assay (FDA approved for NASAL specimens only), is one component of a comprehensive MRSA colonization surveillance program. It is not intended to diagnose MRSA infection nor to guide or monitor treatment for MRSA infections. Test performance is not FDA approved in patients less than 83 years old. Performed at [facility], [place] [address], [place], [place] [address]     Labs: CBC: Recent Labs  Lab 05/14/21 0848 05/14/21 0854 05/15/21 0501 05/15/21 0832  WBC 3.5*  --  3.1* DUP  NEUTROABS  1.6*  --  1.7 PENDING  HGB 11.2* 10.9* 9.9* DUP  HCT 34.4* 32.0* 30.6* DUP  MCV 102.7*  --  105.9* DUP  PLT 151  --  119* DUP   Basic Metabolic Panel: Recent Labs  Lab 05/14/21 0848 05/14/21 0854  NA 136 140  K 3.7 3.9  CL 103 104  CO2 24  --   GLUCOSE 207* 201*  BUN 23 20  CREATININE 1.28* 1.30*  CALCIUM 9.0  --   MG 1.9  --    Liver Function Tests: Recent Labs  Lab 05/14/21 0848  AST 29  ALT 19  ALKPHOS 58  BILITOT 0.7  PROT 6.7  ALBUMIN 3.9  CBG: No results for input(s): GLUCAP in the last 168 hours.  Discharge time spent: greater than 30 minutes.  Signed: Dwyane Dee, MD [facility] 05/15/2021

## 2021-05-15 NOTE — TOC CM/SW Note (Signed)
°  Transition of Care [facility]) Screening Note   Patient Details  Name: Brittany Coleman Date of Birth: [birth year]   Transition of [facility]) CM/SW Contact:    Ross Ludwig, LCSW Phone Number: 05/15/2021, 11:10 AM    Transition of Care Department [facility]) has reviewed patient and no TOC needs have been identified at this time. We will continue to monitor patient advancement through interdisciplinary progression rounds. If new patient transition needs arise, please place a TOC consult.

## 2021-05-15 NOTE — Plan of Care (Signed)
  Problem: Clinical Measurements: Goal: Diagnostic test results will improve Outcome: Progressing Goal: Cardiovascular complication will be avoided Outcome: Progressing   Problem: Activity: Goal: Risk for activity intolerance will decrease Outcome: Progressing   

## 2021-05-15 NOTE — Progress Notes (Signed)
Progress Note  Patient Name: Brittany Coleman Date of Encounter: 05/15/2021  Primary Cardiologist: None   Subjective   No further palpitations.  Patient recounts her symptoms and prior arrest at [facility].  Is eager for DC and has questions regarding her long term care.  Inpatient Medications    Scheduled Meds:  atovaquone  1,500 mg Oral q morning   azelastine  2 spray Each Nare BID   fluticasone  2 spray Each Nare Daily   fluticasone furoate-vilanterol  1 puff Inhalation Daily   Letermovir  480 mg Oral Daily   levothyroxine  50 mcg Oral Q0600   magnesium oxide  400 mg Oral Daily   montelukast  10 mg Oral QHS   pantoprazole  40 mg Oral Daily   predniSONE  5 mg Oral Daily   rosuvastatin  10 mg Oral QHS   tacrolimus  0.5 mg Oral BID   valACYclovir  500 mg Oral BID   Continuous Infusions:  ceFEPime (MAXIPIME) IV Stopped (05/15/21 0700)   vancomycin     PRN Meds: acetaminophen, albuterol, ondansetron (ZOFRAN) IV   Vital Signs    Vitals:   05/14/21 2230 05/14/21 2340 05/14/21 2353 05/15/21 0409  BP: 99/63 111/66  120/68  Pulse: 73 73  74  Resp: _0 Temp:  99.4 F (37.4 C)  98.5 F (36.9 C)  TempSrc:  Oral  Oral  SpO2: 100% 97%  97%  Weight:   77.6 kg   Height:   _1  (1.626 m)     Intake/Output Summary (Last 24 hours) at 05/15/2021 0820 Last data filed at 05/15/2021 0300 Gross per 24 hour  Intake 2854.51 ml  Output --  Net 2854.51 ml   Filed Weights   05/14/21 0847 05/14/21 2353  Weight: 72.6 kg 77.6 kg    Telemetry    SR  - Personally Reviewed  Physical Exam   Gen: no distress   Neck: No JVD Cardiac: No Rubs or Gallops, no Murmur, RRR +2 radial pulses Respiratory: Clear to auscultation bilaterally, normal effort, normal  respiratory rate GI: Soft, nontender, non-distended  MS: No edema;  moves all extremities Integument: Skin feels warm Neuro:  At time of evaluation, alert and oriented to person/place/time/situation  Psych: Normal affect,  patient feels well   Labs    Chemistry Recent Labs  Lab 05/14/21 0848 05/14/21 0854  NA 136 140  K 3.7 3.9  CL 103 104  CO2 24  --   GLUCOSE 207* 201*  BUN 23 20  CREATININE 1.28* 1.30*  CALCIUM 9.0  --   PROT 6.7  --   ALBUMIN 3.9  --   AST 29  --   ALT 19  --   ALKPHOS 58  --   BILITOT 0.7  --   GFRNONAA 47*  --   ANIONGAP 9  --      Hematology Recent Labs  Lab 05/14/21 0848 05/14/21 0854 05/15/21 0501  WBC 3.5*  --  3.1*  RBC 3.35*  --  2.89*  HGB 11.2* 10.9* 9.9*  HCT 34.4* 32.0* 30.6*  MCV 102.7*  --  105.9*  MCH 33.4  --  34.3*  MCHC 32.6  --  32.4  RDW 15.0  --  15.1  PLT 151  --  119*    Cardiac EnzymesNo results for input(s): TROPONINI in the last 168 hours. No results for input(s): TROPIPOC in the last 168 hours.   BNP Recent Labs  Lab 05/14/21  1194  BNP 261.7*     DDimer No results for input(s): DDIMER in the last 168 hours.   Radiology    DG Chest Port 1 View  Result Date: 05/14/2021 CLINICAL DATA:  Chest pain. EXAM: PORTABLE CHEST 1 VIEW COMPARISON:  None. FINDINGS: The heart size and mediastinal contours are within normal limits. Both lungs are clear. The visualized skeletal structures are unremarkable. Surgical wires are seen projected over the cardiomediastinal silhouette consistent with history of lung transplant. IMPRESSION: No active disease. Electronically Signed   By: Marijo Conception M.D.   On: 05/14/2021 09:09   ECHOCARDIOGRAM COMPLETE  Result Date: 05/14/2021    ECHOCARDIOGRAM REPORT   Patient Name:   Brittany Coleman Date of Exam: 05/14/2021 Medical Rec #:  [id number]          Height:       64.0 in Accession #:    [id number]         Weight:       160.0 lb Date of Birth:  [birth year]           BSA:          1.779 m Patient Age:    66 years           BP:           108/68 mmHg Patient Gender: F                  HR:           64 bpm. Exam Location:  Inpatient Procedure: 2D Echo, Cardiac Doppler and Color Doppler Indications:    I48.91  AFIB  History:        Patient has no prior history of Echocardiogram examinations.                 Risk Factors:Diabetes and Dyslipidemia. PULM. SARCOIDOSIS /                 BASAL CELL CARCINOMA.  Sonographer:    Beryle Beams Referring Phys: [id number] [place]  1. Left ventricular ejection fraction, by estimation, is 60 to 65%. The left ventricle has normal function. The left ventricle has no regional wall motion abnormalities. There is mild left ventricular hypertrophy. Left ventricular diastolic parameters are consistent with Grade II diastolic dysfunction (pseudonormalization).  2. Right ventricular systolic function is normal. The right ventricular size is normal. Tricuspid regurgitation signal is inadequate for assessing PA pressure.  3. Left atrial size was mild to moderately dilated.  4. The mitral valve is normal in structure. Trivial mitral valve regurgitation. No evidence of mitral stenosis.  5. The aortic valve is tricuspid. Aortic valve regurgitation is not visualized. Aortic valve sclerosis/calcification is present, without any evidence of aortic stenosis.  6. The inferior vena cava is dilated in size with >50% respiratory variability, suggesting right atrial pressure of 8 mmHg. FINDINGS  Left Ventricle: Left ventricular ejection fraction, by estimation, is 60 to 65%. The left ventricle has normal function. The left ventricle has no regional wall motion abnormalities. The left ventricular internal cavity size was normal in size. There is  mild left ventricular hypertrophy. Left ventricular diastolic parameters are consistent with Grade II diastolic dysfunction (pseudonormalization). Right Ventricle: The right ventricular size is normal. No increase in right ventricular wall thickness. Right ventricular systolic function is normal. Tricuspid regurgitation signal is inadequate for assessing PA pressure. Left Atrium: Left atrial size was mild to moderately dilated. Right Atrium: Right  atrial  size was normal in size. Pericardium: There is no evidence of pericardial effusion. Mitral Valve: The mitral valve is normal in structure. There is mild calcification of the mitral valve leaflet(s). Mild mitral annular calcification. Trivial mitral valve regurgitation. No evidence of mitral valve stenosis. MV peak gradient, 72.6 mmHg. The mean mitral valve gradient is 51.0 mmHg. Tricuspid Valve: The tricuspid valve is normal in structure. Tricuspid valve regurgitation is not demonstrated. Aortic Valve: The aortic valve is tricuspid. Aortic valve regurgitation is not visualized. Aortic valve sclerosis/calcification is present, without any evidence of aortic stenosis. Aortic valve mean gradient measures 3.0 mmHg. Aortic valve peak gradient measures 5.4 mmHg. Aortic valve area, by VTI measures 2.27 cm. Pulmonic Valve: The pulmonic valve was normal in structure. Pulmonic valve regurgitation is trivial. Aorta: The aortic root is normal in size and structure. Venous: The inferior vena cava is dilated in size with greater than 50% respiratory variability, suggesting right atrial pressure of 8 mmHg. IAS/Shunts: No atrial level shunt detected by color flow Doppler.  LEFT VENTRICLE PLAX 2D LVIDd:         3.60 cm     Diastology LVIDs:         1.60 cm     LV e' medial:    5.00 cm/s LV PW:         1.00 cm     LV E/e' medial:  19.6 LV IVS:        1.00 cm     LV e' lateral:   8.81 cm/s LVOT diam:     1.80 cm     LV E/e' lateral: 11.1 LV SV:         62 LV SV Index:   35 LVOT Area:     2.54 cm  LV Volumes (MOD) LV vol d, MOD A2C: 42.0 ml LV vol d, MOD A4C: 34.6 ml LV vol s, MOD A2C: 13.0 ml LV vol s, MOD A4C: 13.5 ml LV SV MOD A2C:     29.0 ml LV SV MOD A4C:     34.6 ml LV SV MOD BP:      26.2 ml RIGHT VENTRICLE            IVC RV S prime:     8.49 cm/s  IVC diam: 2.20 cm RVOT diam:      2.20 cm TAPSE (M-mode): 1.1 cm LEFT ATRIUM             Index        RIGHT ATRIUM           Index LA diam:        3.70 cm 2.08 cm/m   RA  Area:     10.00 cm LA Vol (A2C):   50.5 ml 28.38 ml/m  RA Volume:   20.90 ml  11.75 ml/m LA Vol (A4C):   57.4 ml 32.26 ml/m LA Biplane Vol: 53.9 ml 30.29 ml/m  AORTIC VALVE                    PULMONIC VALVE AV Area (Vmax):    1.94 cm     PV Vmax:       0.47 m/s AV Area (Vmean):   2.05 cm     PV Vmean:      30.500 cm/s AV Area (VTI):     2.27 cm     PV VTI:        0.083 m AV Vmax:  116.00 cm/s  PV Peak grad:  0.9 mmHg AV Vmean:          83.000 cm/s  PV Mean grad:  0.0 mmHg AV VTI:            0.273 m AV Peak Grad:      5.4 mmHg AV Mean Grad:      3.0 mmHg LVOT Vmax:         88.30 cm/s LVOT Vmean:        67.000 cm/s LVOT VTI:          0.244 m LVOT/AV VTI ratio: 0.89  AORTA Ao Root diam: 3.20 cm Ao Asc diam:  3.20 cm MITRAL VALVE MV Area (PHT): 3.77 cm    SHUNTS MV Area VTI:   0.41 cm    Systemic VTI:  0.24 m MV Peak grad:  72.6 mmHg   Systemic Diam: 1.80 cm MV Mean grad:  51.0 mmHg   Pulmonic Diam: 2.20 cm MV Vmax:       4.26 m/s MV Vmean:      338.0 cm/s MV Decel Time: 201 msec MV E velocity: 97.90 cm/s MV A velocity: 66.70 cm/s MV E/A ratio:  1.47 Dalton McleanMD Electronically signed by Franki Monte Signature Date/Time: 05/14/2021/3:57:28 PM    Final     Cardiac Studies   LVEF is preserved.  Patient Profile     66 y.o. 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,  DM with HLD, Hypothyroidism with prior PVC ablation ; hx of post op PAF with prior IVC filter - no recurrence - would avoid diltiazem in the setting of tacrolimus - discussed metoprolol 25 mg PO as a PRN - we discussed that if this was AFL, as opposed to AVNRT that given  her gender and DM AC would be considered - has Visual merchandiser; EP f/u at [facility] - we discussed there she has not met 6 month driving restriction in the state of [place]; but I think it would be reasonable to let her husband drive to make sure she has no further symptomatic arrhythmia   [facility] will sign off.   Medication Recommendations:  metoprolol 25 mg PO PRN q 6 hr Other recommendations (labs, testing, etc):  NA; if patient chooses to establish with [facility] instead we would send an outpatient heart monitor; presently she is symptomatic during events and has Apple Watch Follow up as an outpatient:  planned for non [facility] f/u  For questions or updates, please contact [facility] Please consult [email or web address] for contact info under Cardiology/STEMI.      Signed, Werner Lean, MD  05/15/2021, 8:20 AM

## 2021-05-15 NOTE — Progress Notes (Signed)
Pt discharge education and instructions completed with pt and spouse at beside. Both voices understanding and denies any questions. Pt IV and telemetry removed prior to discharge. Pt to pick up electronically sent prescription from preferred pharmacy on file. Pt discharge home and husband to transport her home. Pt offered wheelchair but declines and chose to ambulate off unit with spouse and belongings to the side. Francis Gaines Griselle Rufer RN.

## 2021-05-17 LAB — TACROLIMUS LEVEL: Tacrolimus (FK506) - LabCorp: 8.3 ng/mL (ref 2.0–20.0)

## 2021-05-19 LAB — CULTURE, BLOOD (ROUTINE X 2)
Culture: NO GROWTH
Special Requests: ADEQUATE

## 2021-05-20 LAB — CULTURE, BLOOD (ROUTINE X 2)
Culture: NO GROWTH
Special Requests: ADEQUATE

## 2023-03-09 IMAGING — DX DG CHEST 1V PORT
1 series · 1 of 1 positions shown · non-contrast
Comparison: None.

CLINICAL DATA: Chest pain.

EXAM:
PORTABLE CHEST 1 VIEW

[chest ap]
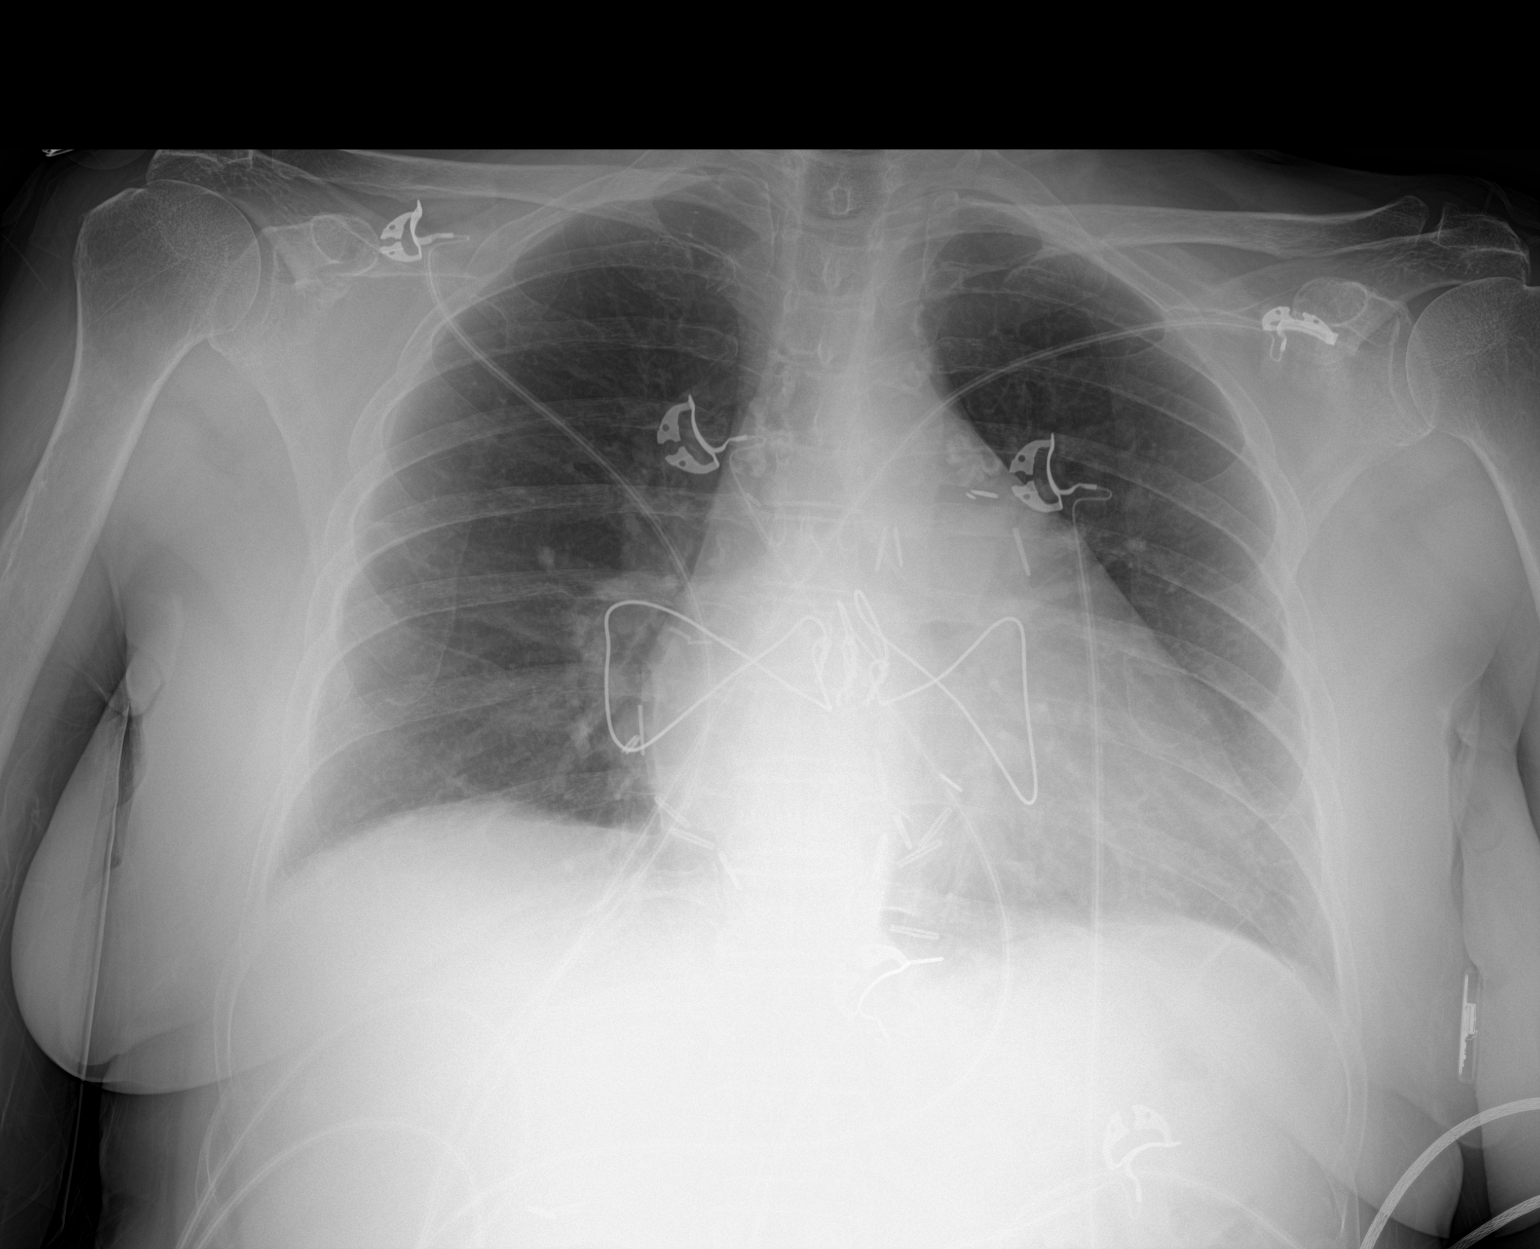

[1 of 1 positions shown; findings below may reference images not displayed]

FINDINGS: The heart size and mediastinal contours are within normal limits.
Both lungs are clear. The visualized skeletal structures are
unremarkable. Surgical wires are seen projected over the
cardiomediastinal silhouette consistent with history of lung
transplant.
IMPRESSION: No active disease.
